# Patient Record
Sex: Male | Born: 1980
Health system: Midwestern US, Community
[De-identification: ages and names within clinical notes are randomized; demographics above are authoritative.]

## PROBLEM LIST (undated history)

## (undated) DIAGNOSIS — N189 Chronic kidney disease, unspecified: Secondary | ICD-10-CM

## (undated) DIAGNOSIS — I10 Essential (primary) hypertension: Secondary | ICD-10-CM

## (undated) HISTORY — PX: LITHOTRIPSY: SUR834

## (undated) HISTORY — DX: Essential (primary) hypertension: I10

## (undated) HISTORY — PX: OTHER SURGICAL HISTORY: SHX169

## (undated) HISTORY — DX: Chronic kidney disease, unspecified: N18.9

---

## 2005-06-13 ENCOUNTER — Emergency Department (HOSPITAL_COMMUNITY): Admission: EM | Admit: 2005-06-13 | Discharge: 2005-06-13 | Payer: Self-pay | Admitting: Emergency Medicine

## 2006-04-24 ENCOUNTER — Ambulatory Visit (HOSPITAL_COMMUNITY): Admission: RE | Admit: 2006-04-24 | Discharge: 2006-04-24 | Payer: Self-pay | Admitting: Specialist

## 2010-07-21 ENCOUNTER — Encounter: Admission: RE | Admit: 2010-07-21 | Discharge: 2010-07-21 | Payer: Self-pay | Admitting: Anesthesiology

## 2011-11-06 ENCOUNTER — Other Ambulatory Visit (HOSPITAL_COMMUNITY): Payer: Self-pay | Admitting: Physician Assistant

## 2011-11-06 ENCOUNTER — Ambulatory Visit (HOSPITAL_COMMUNITY)
Admission: RE | Admit: 2011-11-06 | Discharge: 2011-11-06 | Disposition: A | Discharge status: Home / Self Care | Payer: 59 | Source: Ambulatory Visit | Attending: Physician Assistant | Admitting: Physician Assistant

## 2011-11-06 DIAGNOSIS — M546 Pain in thoracic spine: Secondary | ICD-10-CM | POA: Insufficient documentation

## 2011-11-06 DIAGNOSIS — M549 Dorsalgia, unspecified: Secondary | ICD-10-CM

## 2012-01-03 ENCOUNTER — Other Ambulatory Visit: Payer: Self-pay | Admitting: Anesthesiology

## 2012-01-03 DIAGNOSIS — M545 Low back pain: Secondary | ICD-10-CM

## 2012-01-05 ENCOUNTER — Ambulatory Visit
Admission: RE | Admit: 2012-01-05 | Discharge: 2012-01-05 | Disposition: A | Discharge status: Home / Self Care | Payer: 59 | Source: Ambulatory Visit | Attending: Anesthesiology | Admitting: Anesthesiology

## 2012-01-05 DIAGNOSIS — M545 Low back pain: Secondary | ICD-10-CM

## 2012-07-29 ENCOUNTER — Ambulatory Visit (INDEPENDENT_AMBULATORY_CARE_PROVIDER_SITE_OTHER): Payer: 59 | Admitting: Emergency Medicine

## 2012-07-29 VITALS — BP 118/88 | HR 88 | Temp 97.6°F | Resp 16 | Ht 67.0 in | Wt 144.0 lb

## 2012-07-29 DIAGNOSIS — IMO0002 Reserved for concepts with insufficient information to code with codable children: Secondary | ICD-10-CM

## 2012-07-29 DIAGNOSIS — M24419 Recurrent dislocation, unspecified shoulder: Secondary | ICD-10-CM

## 2012-07-29 DIAGNOSIS — S46919A Strain of unspecified muscle, fascia and tendon at shoulder and upper arm level, unspecified arm, initial encounter: Secondary | ICD-10-CM

## 2012-07-29 MED ORDER — NAPROXEN SODIUM 550 MG PO TABS: 550.0000 mg | ORAL_TABLET | Freq: Two times a day (BID) | ORAL | Status: AC

## 2012-07-29 MED ORDER — CYCLOBENZAPRINE HCL 10 MG PO TABS: 10.0000 mg | ORAL_TABLET | Freq: Three times a day (TID) | ORAL | Status: AC | PRN

## 2012-07-29 NOTE — Progress Notes (Signed)
   Date:  07/29/2012   Name:  Wesley Gross   DOB:  August 31, 1981   MRN:  [id number] Gender: male Age: 31 y.o.  PCP:  No primary provider on file.    Chief Complaint: Shoulder Injury   History of Present Illness:  Wesley Gross is a 31 y.o. 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 Yesterday was tubing and re-injured his left shoulder while tubing.  Is on a pain management contract and takes cymbalta for pain control.  Has a printed out "DNA study" that shows medications that are effective and which are not for him.  Says that pain is worse with activity and lying on that side and radiates up into neck.  There are no neuro symptoms or radicular symptoms.  Pain less when he does not use it.  Worse when he raises arm to parallel to floor.  There is no problem list on file for this patient.   No past medical history on file.  No past surgical history on file.  History  Substance Use Topics  . Smoking status: Current Everyday Smoker  . Smokeless tobacco: Not on file  . Alcohol Use: Not on file    No family history on file.  Allergies  Allergen Reactions  . Cephalexin Nausea And Vomiting    Medication list has been reviewed and updated.  Current Outpatient Prescriptions on File Prior to Visit  Medication Sig Dispense Refill  . DULoxetine (CYMBALTA) 30 MG capsule Take 90 mg by mouth daily.        Review of Systems:  As per HPI, otherwise negative.    Physical Examination: Filed Vitals:   07/29/12 0810  BP: 118/88  Pulse: 88  Temp: 97.6 F (36.4 C)  Resp: 16   Filed Vitals:   07/29/12 0810  Height: 5\' 7"  (1.702 m)  Weight: 144 lb (65.318 kg)   Body mass index is 22.55 kg/(m^2). Ideal Body Weight: Weight in (lb) to have BMI = 25: 159.3    GEN: WDWN, NAD, Non-toxic, Alert & Oriented x 3 HEENT: Atraumatic, Normocephalic.  Ears and Nose: No external deformity. EXTR: No  clubbing/cyanosis/edema NEURO: Normal gait.  PSYCH: Normally interactive. Conversant. Not depressed or anxious appearing.  Calm demeanor.  Left Shoulder:  Tender trapezius.  Gross motor intact.  Normal strength and active ROM.    Assessment and Plan: Shoulder strain and history of chronic dislocations Ortho consult Anaprox Flexeril Follow up as needed.   Carmelina Dane, MD

## 2013-12-22 ENCOUNTER — Other Ambulatory Visit: Payer: Self-pay | Admitting: Anesthesiology

## 2013-12-22 DIAGNOSIS — M545 Low back pain, unspecified: Secondary | ICD-10-CM

## 2013-12-27 ENCOUNTER — Ambulatory Visit
Admission: RE | Admit: 2013-12-27 | Discharge: 2013-12-27 | Disposition: A | Discharge status: Home / Self Care | Payer: 59 | Source: Ambulatory Visit | Attending: Anesthesiology | Admitting: Anesthesiology

## 2013-12-27 DIAGNOSIS — M545 Low back pain, unspecified: Secondary | ICD-10-CM

## 2015-01-04 ENCOUNTER — Ambulatory Visit: Payer: Self-pay | Admitting: Podiatry

## 2015-01-04 ENCOUNTER — Ambulatory Visit (INDEPENDENT_AMBULATORY_CARE_PROVIDER_SITE_OTHER): Payer: 59 | Admitting: Podiatry

## 2015-01-04 ENCOUNTER — Encounter: Payer: Self-pay | Admitting: Podiatry

## 2015-01-04 ENCOUNTER — Ambulatory Visit: Payer: Self-pay

## 2015-01-04 VITALS — BP 147/95 | HR 71 | Resp 14

## 2015-01-04 DIAGNOSIS — M779 Enthesopathy, unspecified: Secondary | ICD-10-CM

## 2015-01-04 DIAGNOSIS — M79671 Pain in right foot: Secondary | ICD-10-CM

## 2015-01-04 DIAGNOSIS — M722 Plantar fascial fibromatosis: Secondary | ICD-10-CM

## 2015-01-04 MED ORDER — DICLOFENAC SODIUM 75 MG PO TBEC: 75.0000 mg | DELAYED_RELEASE_TABLET | Freq: Two times a day (BID) | ORAL | Status: DC

## 2015-01-04 MED ORDER — TRIAMCINOLONE ACETONIDE 10 MG/ML IJ SUSP
10.0000 mg | Freq: Once | INTRAMUSCULAR | Status: AC
  Administered 2015-01-04: 10 mg

## 2015-01-04 NOTE — Progress Notes (Signed)
   Subjective:    Patient ID: Wesley Gross, male    DOB: 06/17/1981, 34 y.o.   MRN: [id number]  HPI Pt presents with pain in ball and arch of right foot   Review of Systems  All other systems reviewed and are negative.      Objective:   Physical Exam        Assessment & Plan:

## 2015-01-06 NOTE — Progress Notes (Signed)
Subjective:     Patient ID: Wesley Gross, male   DOB: 10/05/1981, 34 y.o.   MRN: [id number]  HPI patient presents stating he's had a lot of pain in his forefoot for the last several months and does not remember specific injury. States it's worse with ambulation and that it is gradually making it hard for him to be active   Review of Systems  All other systems reviewed and are negative.      Objective:   Physical Exam  Constitutional: He is oriented to person, place, and time.  Cardiovascular: Intact distal pulses.   Musculoskeletal: Normal range of motion.  Neurological: He is oriented to person, place, and time.  Skin: Skin is warm.  Nursing note and vitals reviewed.  neurovascular status is intact with muscle strength adequate and range of motion subtalar midtarsal joint within normal limits. Patient is noted to have discomfort with inflammation and pain second metatarsophalangeal joint right with fluid buildup within the joint and pain also spreading into the arch and heel of a mild to moderate nature     Assessment:     Inflammatory capsulitis second MPJ right with inflammation and fluid    Plan:     H&P and condition discussed with patient. At this point I have recommended careful injection and did proximal nerve block and explained risk of procedure. I aspirated the joint giving out a small amount of clear fluid and injected with half cc of dexamethasone Kenalog and applied padding to reduce stress. Reappoint to reevaluate in the next several weeks

## 2015-01-13 ENCOUNTER — Encounter: Payer: Self-pay | Admitting: Podiatry

## 2015-01-13 ENCOUNTER — Ambulatory Visit (INDEPENDENT_AMBULATORY_CARE_PROVIDER_SITE_OTHER): Payer: 59 | Admitting: Podiatry

## 2015-01-13 VITALS — BP 142/100 | HR 77 | Resp 16

## 2015-01-13 DIAGNOSIS — M722 Plantar fascial fibromatosis: Secondary | ICD-10-CM

## 2015-01-13 DIAGNOSIS — M779 Enthesopathy, unspecified: Secondary | ICD-10-CM

## 2015-01-13 NOTE — Progress Notes (Signed)
Subjective:     Patient ID: Wesley Gross, male   DOB: 12/10/1980, 34 y.o.   MRN: [id number]  HPI patient states that it has helped the area of my second toe joint but that the big toe joint has been sore on my right foot and that does not seem to have improved   Review of Systems     Objective:   Physical Exam Neurovascular status intact no change in health history with discomfort that has alleviated second MPJ right and discomfort in the big toe joint right on both the medial and dorsal side with inflammation and moderate elevation of the arch with chronic pain within the fascia itself    Assessment:     Inflammatory capsulitis first MPJ right with improvement of the second MPJ right and chronic fasciitis symptoms    Plan:     Micah FlesherWent ahead and injected around the first MPJ 3 mg Kenalog 5 mg Xylocaine and scanned for custom orthotics to reduce all plantar stress against the foot. Reappoint when orthotics returned

## 2015-02-03 ENCOUNTER — Ambulatory Visit: Payer: 59 | Admitting: *Deleted

## 2015-02-03 DIAGNOSIS — M722 Plantar fascial fibromatosis: Secondary | ICD-10-CM

## 2015-02-03 NOTE — Patient Instructions (Signed)

## 2015-02-03 NOTE — Progress Notes (Signed)
Patient ID: Wesley Gross, male   DOB: 03/28/1981, 34 y.o.   MRN: [id number] PICKING UP INSERTS

## 2015-03-12 ENCOUNTER — Ambulatory Visit
Admission: RE | Admit: 2015-03-12 | Discharge: 2015-03-12 | Disposition: A | Discharge status: Home / Self Care | Payer: Self-pay | Source: Ambulatory Visit | Attending: Internal Medicine | Admitting: Internal Medicine

## 2015-03-12 ENCOUNTER — Other Ambulatory Visit: Payer: Self-pay | Admitting: Internal Medicine

## 2015-03-12 DIAGNOSIS — Z720 Tobacco use: Secondary | ICD-10-CM

## 2017-05-09 DIAGNOSIS — M545 Low back pain: Secondary | ICD-10-CM | POA: Diagnosis not present

## 2017-05-09 DIAGNOSIS — M791 Myalgia: Secondary | ICD-10-CM | POA: Diagnosis not present

## 2017-05-09 DIAGNOSIS — M461 Sacroiliitis, not elsewhere classified: Secondary | ICD-10-CM | POA: Diagnosis not present

## 2017-10-12 ENCOUNTER — Ambulatory Visit (INDEPENDENT_AMBULATORY_CARE_PROVIDER_SITE_OTHER): Payer: 59

## 2017-10-12 ENCOUNTER — Encounter: Payer: Self-pay | Admitting: Emergency Medicine

## 2017-10-12 ENCOUNTER — Ambulatory Visit (INDEPENDENT_AMBULATORY_CARE_PROVIDER_SITE_OTHER): Payer: 59 | Admitting: Emergency Medicine

## 2017-10-12 VITALS — BP 136/82 | HR 84 | Temp 99.6°F | Resp 17 | Ht 69.5 in | Wt 150.0 lb

## 2017-10-12 DIAGNOSIS — R091 Pleurisy: Secondary | ICD-10-CM | POA: Diagnosis not present

## 2017-10-12 DIAGNOSIS — R05 Cough: Secondary | ICD-10-CM | POA: Diagnosis not present

## 2017-10-12 DIAGNOSIS — J209 Acute bronchitis, unspecified: Secondary | ICD-10-CM

## 2017-10-12 DIAGNOSIS — F17200 Nicotine dependence, unspecified, uncomplicated: Secondary | ICD-10-CM | POA: Diagnosis not present

## 2017-10-12 DIAGNOSIS — R062 Wheezing: Secondary | ICD-10-CM

## 2017-10-12 MED ORDER — AZITHROMYCIN 250 MG PO TABS: ORAL_TABLET | ORAL | 0 refills | Status: DC

## 2017-10-12 MED ORDER — METHYLPREDNISOLONE SODIUM SUCC 125 MG IJ SOLR
125.0000 mg | Freq: Once | INTRAMUSCULAR | Status: AC
  Administered 2017-10-12: 125 mg via INTRAMUSCULAR

## 2017-10-12 MED ORDER — ALBUTEROL SULFATE HFA 108 (90 BASE) MCG/ACT IN AERS: 2.0000 | INHALATION_SPRAY | Freq: Four times a day (QID) | RESPIRATORY_TRACT | 0 refills | Status: DC | PRN

## 2017-10-12 MED ORDER — PREDNISONE 20 MG PO TABS: 40.0000 mg | ORAL_TABLET | Freq: Every day | ORAL | 0 refills | Status: AC

## 2017-10-12 NOTE — Patient Instructions (Addendum)
     IF you received an x-ray today, you will receive an invoice from [facility]. Please contact [facility] at [phone] with questions or concerns regarding your invoice.   IF you received labwork today, you will receive an invoice from [facility]. Please contact [facility] at [phone] with questions or concerns regarding your invoice.   Our billing staff will not be able to assist you with questions regarding bills from these companies.  You will be contacted with the lab results as soon as they are available. The fastest way to get your results is to activate your My Chart account. Instructions are located on the last page of this paperwork. If you have not heard from us regarding the results in 2 weeks, please contact this office.      Acute Bronchitis, Adult Acute bronchitis is when air tubes (bronchi) in the lungs suddenly get swollen. The condition can make it hard to breathe. It can also cause these symptoms:  A cough.  Coughing up clear, yellow, or green mucus.  Wheezing.  Chest congestion.  Shortness of breath.  A fever.  Body aches.  Chills.  A sore throat.  Follow these instructions at home: Medicines  Take over-the-counter and prescription medicines only as told by your doctor.  If you were prescribed an antibiotic medicine, take it as told by your doctor. Do not stop taking the antibiotic even if you start to feel better. General instructions  Rest.  Drink enough fluids to keep your pee (urine) clear or pale yellow.  Avoid smoking and secondhand smoke. If you smoke and you need help quitting, ask your doctor. Quitting will help your lungs heal faster.  Use an inhaler, cool mist vaporizer, or humidifier as told by your doctor.  Keep all follow-up visits as told by your doctor. This is important. How is this prevented? To lower your risk of getting this condition again:  Wash your hands often with soap and water. If you cannot  use soap and water, use hand sanitizer.  Avoid contact with people who have cold symptoms.  Try not to touch your hands to your mouth, nose, or eyes.  Make sure to get the flu shot every year.  Contact a doctor if:  Your symptoms do not get better in 2 weeks. Get help right away if:  You cough up blood.  You have chest pain.  You have very bad shortness of breath.  You become dehydrated.  You faint (pass out) or keep feeling like you are going to pass out.  You keep throwing up (vomiting).  You have a very bad headache.  Your fever or chills gets worse. This information is not intended to replace advice given to you by your health care provider. Make sure you discuss any questions you have with your health care provider. Document Released: 05/01/2008 Document Revised: 06/21/2016 Document Reviewed: 05/03/2016 Elsevier Interactive Patient Education  2017 Elsevier Inc.  

## 2017-10-12 NOTE — Progress Notes (Signed)
Wesley Gross 36 y.o.   Chief Complaint  Patient presents with  . URI  . Cough    HISTORY OF PRESENT ILLNESS: This is a 36 y.o. male complaining of URI symptoms x 2 weeks; saw MD who started Amoxicillin and Tessalon; not better; smoker.  Cough  This is a new problem. The current episode started 1 to 4 weeks ago. The problem has been gradually worsening. The problem occurs every few minutes. The cough is productive of sputum. Associated symptoms include chest pain (pleuritic) and wheezing. Pertinent negatives include no chills, eye redness, fever, headaches, hemoptysis, rash or sore throat.     Prior to Admission medications   Not on File    Allergies  Allergen Reactions  . Cephalexin Nausea And Vomiting    There are no active problems to display for this patient.   No past medical history on file.  No past surgical history on file.  Social History   Socioeconomic History  . Marital status: Married    Spouse name: Not on file  . Number of children: Not on file  . Years of education: Not on file  . Highest education level: Not on file  Social Needs  . Financial resource strain: Not on file  . Food insecurity - worry: Not on file  . Food insecurity - inability: Not on file  . Transportation needs - medical: Not on file  . Transportation needs - non-medical: Not on file  Occupational History  . Not on file  Tobacco Use  . Smoking status: Current Every Day Smoker    Packs/day: 2.00    Years: 22.00    Pack years: 44.00  . Smokeless tobacco: Never Used  Substance and Sexual Activity  . Alcohol use: No    Frequency: Never  . Drug use: No  . Sexual activity: No  Other Topics Concern  . Not on file  Social History Narrative  . Not on file    No family history on file.   Review of Systems  Constitutional: Negative for chills, fever and malaise/fatigue.  HENT: Positive for congestion. Negative for sore throat.   Eyes: Negative for discharge and redness.    Respiratory: Positive for cough, sputum production and wheezing. Negative for hemoptysis.   Cardiovascular: Positive for chest pain (pleuritic). Negative for leg swelling.  Gastrointestinal: Negative for abdominal pain, diarrhea, nausea and vomiting.  Genitourinary: Negative for dysuria and hematuria.  Musculoskeletal: Negative.  Negative for neck pain.  Skin: Negative for rash.  Neurological: Negative for dizziness and headaches.  Endo/Heme/Allergies: Negative.   All other systems reviewed and are negative.  Vitals:   10/12/17 1707  BP: 136/82  Pulse: 84  Resp: 17  Temp: 99.6 F (37.6 C)  SpO2: 98%      Physical Exam  Constitutional: He is oriented to person, place, and time. He appears well-developed and well-nourished.  HENT:  Head: Normocephalic and atraumatic.  Nose: Nose normal.  Mouth/Throat: Oropharynx is clear and moist.  Eyes: Conjunctivae and EOM are normal. Pupils are equal, round, and reactive to light.  Neck: Normal range of motion. Neck supple. No thyromegaly present.  Cardiovascular: Normal rate, regular rhythm and normal heart sounds.  Pulmonary/Chest: Effort normal. He has wheezes.  Abdominal: Soft. Bowel sounds are normal. There is no tenderness.  Musculoskeletal: Normal range of motion.  Lymphadenopathy:    He has no cervical adenopathy.  Neurological: He is alert and oriented to person, place, and time. No sensory deficit. He exhibits normal muscle  tone.  Skin: Skin is warm and dry. Capillary refill takes less than 2 seconds. No rash noted.  Psychiatric: He has a normal mood and affect. His behavior is normal.  Vitals reviewed.    ASSESSMENT & PLAN:  Wesley Gross was seen today for uri and cough.  Diagnoses and all orders for this visit:  Acute bronchitis, unspecified organism -     DG Chest 2 View; Future -     methylPREDNISolone sodium succinate (SOLU-MEDROL) 125 mg/2 mL injection 125 mg  Pleurisy  Wheezing  Smoker  Other orders -      predniSONE (DELTASONE) 20 MG tablet; Take 2 tablets (40 mg total) daily with breakfast for 5 days by mouth. -     azithromycin (ZITHROMAX) 250 MG tablet; Sig as indicated -     albuterol (PROVENTIL HFA;VENTOLIN HFA) 108 (90 Base) MCG/ACT inhaler; Inhale 2 puffs every 6 (six) hours as needed into the lungs for wheezing or shortness of breath.    Patient Instructions       IF you received an x-ray today, you will receive an [facility]. Please contact [facility] at [phone] with questions or concerns regarding your invoice.   IF you received labwork today, you will receive an invoice from [facility]. Please contact [facility] at [phone] with questions or concerns regarding your invoice.   Our billing staff will not be able to assist you with questions regarding bills from these companies.  You will be contacted with the lab results as soon as they are available. The fastest way to get your results is to activate your My Chart account. Instructions are located on the last page of this paperwork. If you have not heard from us regarding the results in 2 weeks, please contact this office.      Acute Bronchitis, Adult Acute bronchitis is when air tubes (bronchi) in the lungs suddenly get swollen. The condition can make it hard to breathe. It can also cause these symptoms:  A cough.  Coughing up clear, yellow, or green mucus.  Wheezing.  Chest congestion.  Shortness of breath.  A fever.  Body aches.  Chills.  A sore throat.  Follow these instructions at home: Medicines  Take over-the-counter and prescription medicines only as told by your doctor.  If you were prescribed an antibiotic medicine, take it as told by your doctor. Do not stop taking the antibiotic even if you start to feel better. General instructions  Rest.  Drink enough fluids to keep your pee (urine) clear or pale yellow.  Avoid smoking and secondhand smoke. If you  smoke and you need help quitting, ask your doctor. Quitting will help your lungs heal faster.  Use an inhaler, cool mist vaporizer, or humidifier as told by your doctor.  Keep all follow-up visits as told by your doctor. This is important. How is this prevented? To lower your risk of getting this condition again:  Wash your hands often with soap and water. If you cannot use soap and water, use hand sanitizer.  Avoid contact with people who have cold symptoms.  Try not to touch your hands to your mouth, nose, or eyes.  Make sure to get the flu shot every year.  Contact a doctor if:  Your symptoms do not get better in 2 weeks. Get help right away if:  You cough up blood.  You have chest pain.  You have very bad shortness of breath.  You become dehydrated.  You faint (pass out) or  keep feeling like you are going to pass out.  You keep throwing up (vomiting).  You have a very bad headache.  Your fever or chills gets worse. This information is not intended to replace advice given to you by your health care provider. Make sure you discuss any questions you have with your health care provider. Document Released: 05/01/2008 Document Revised: 06/21/2016 Document Reviewed: 05/03/2016 Elsevier Interactive Patient Education  2017 Elsevier Inc.     Edwina BarthMiguel Berdell Hostetler, MD [facility]

## 2017-12-29 IMAGING — DX DG CHEST 2V
2 series · 2 of 2 positions shown · non-contrast
Comparison: None.

CLINICAL DATA: Cough.

EXAM:
CHEST  2 VIEW

[chest pa]
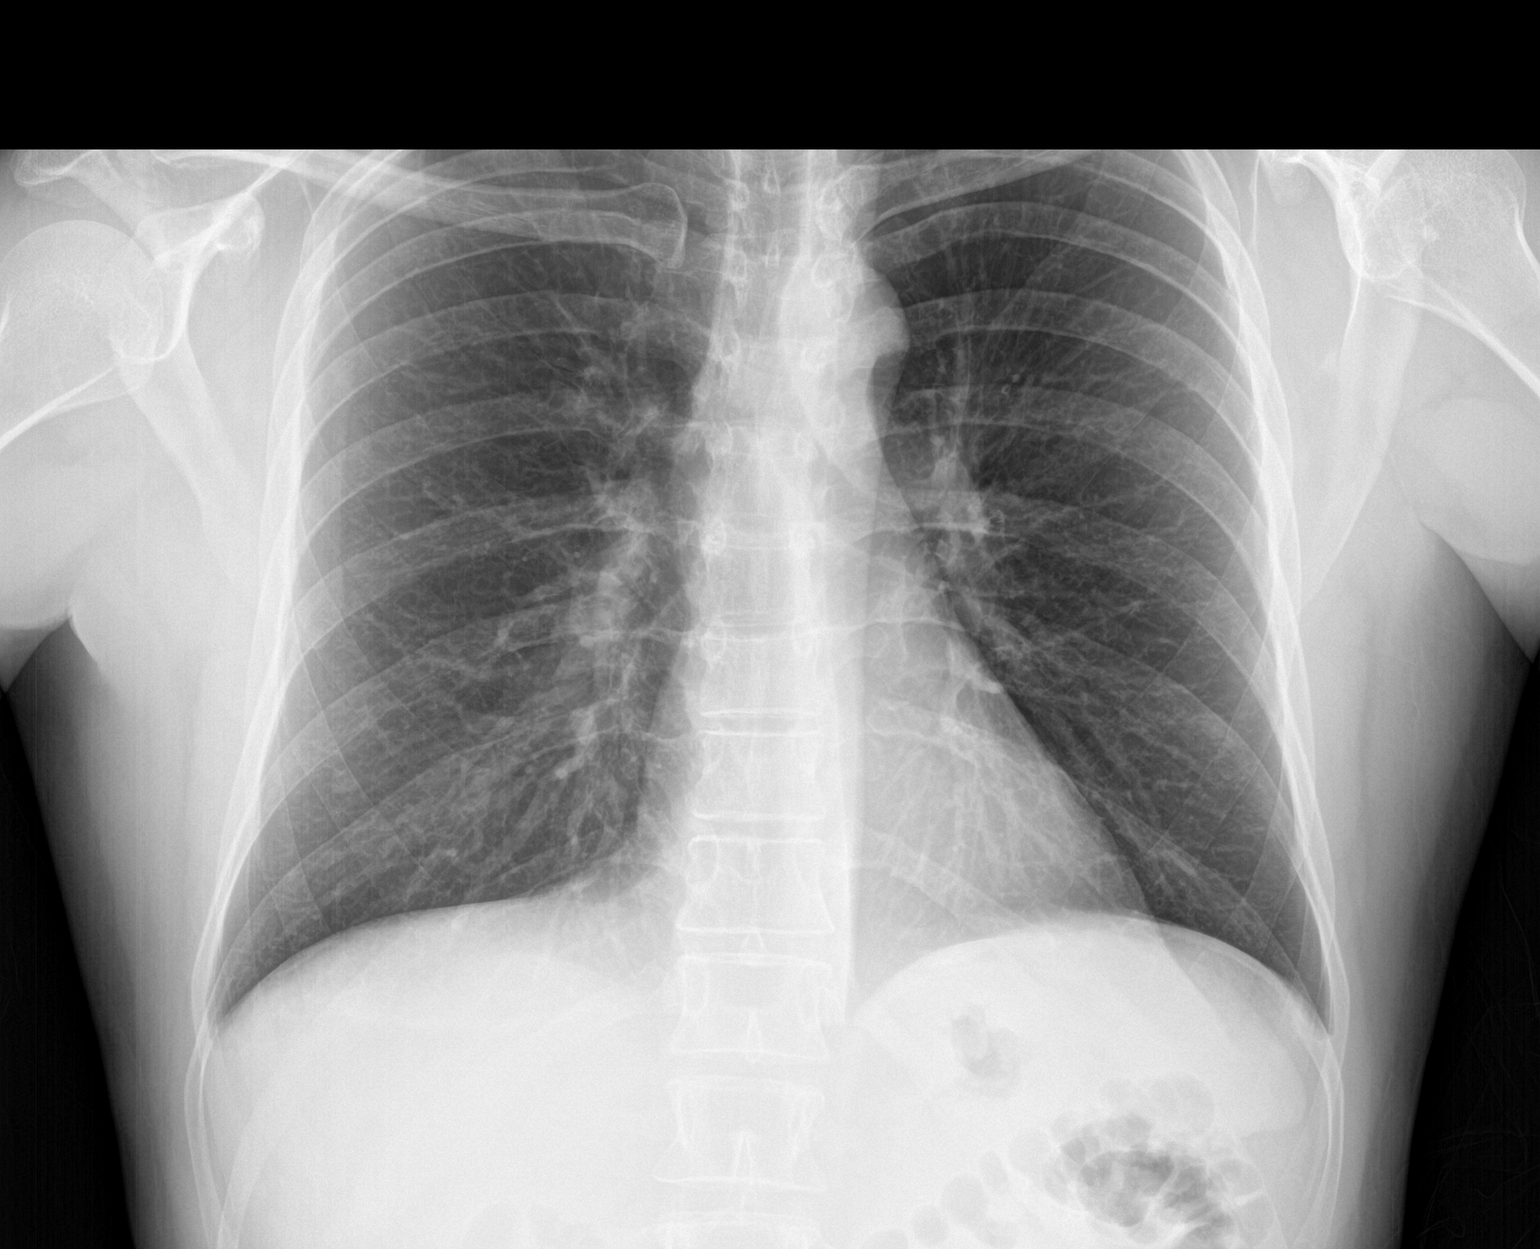

[chest lat]
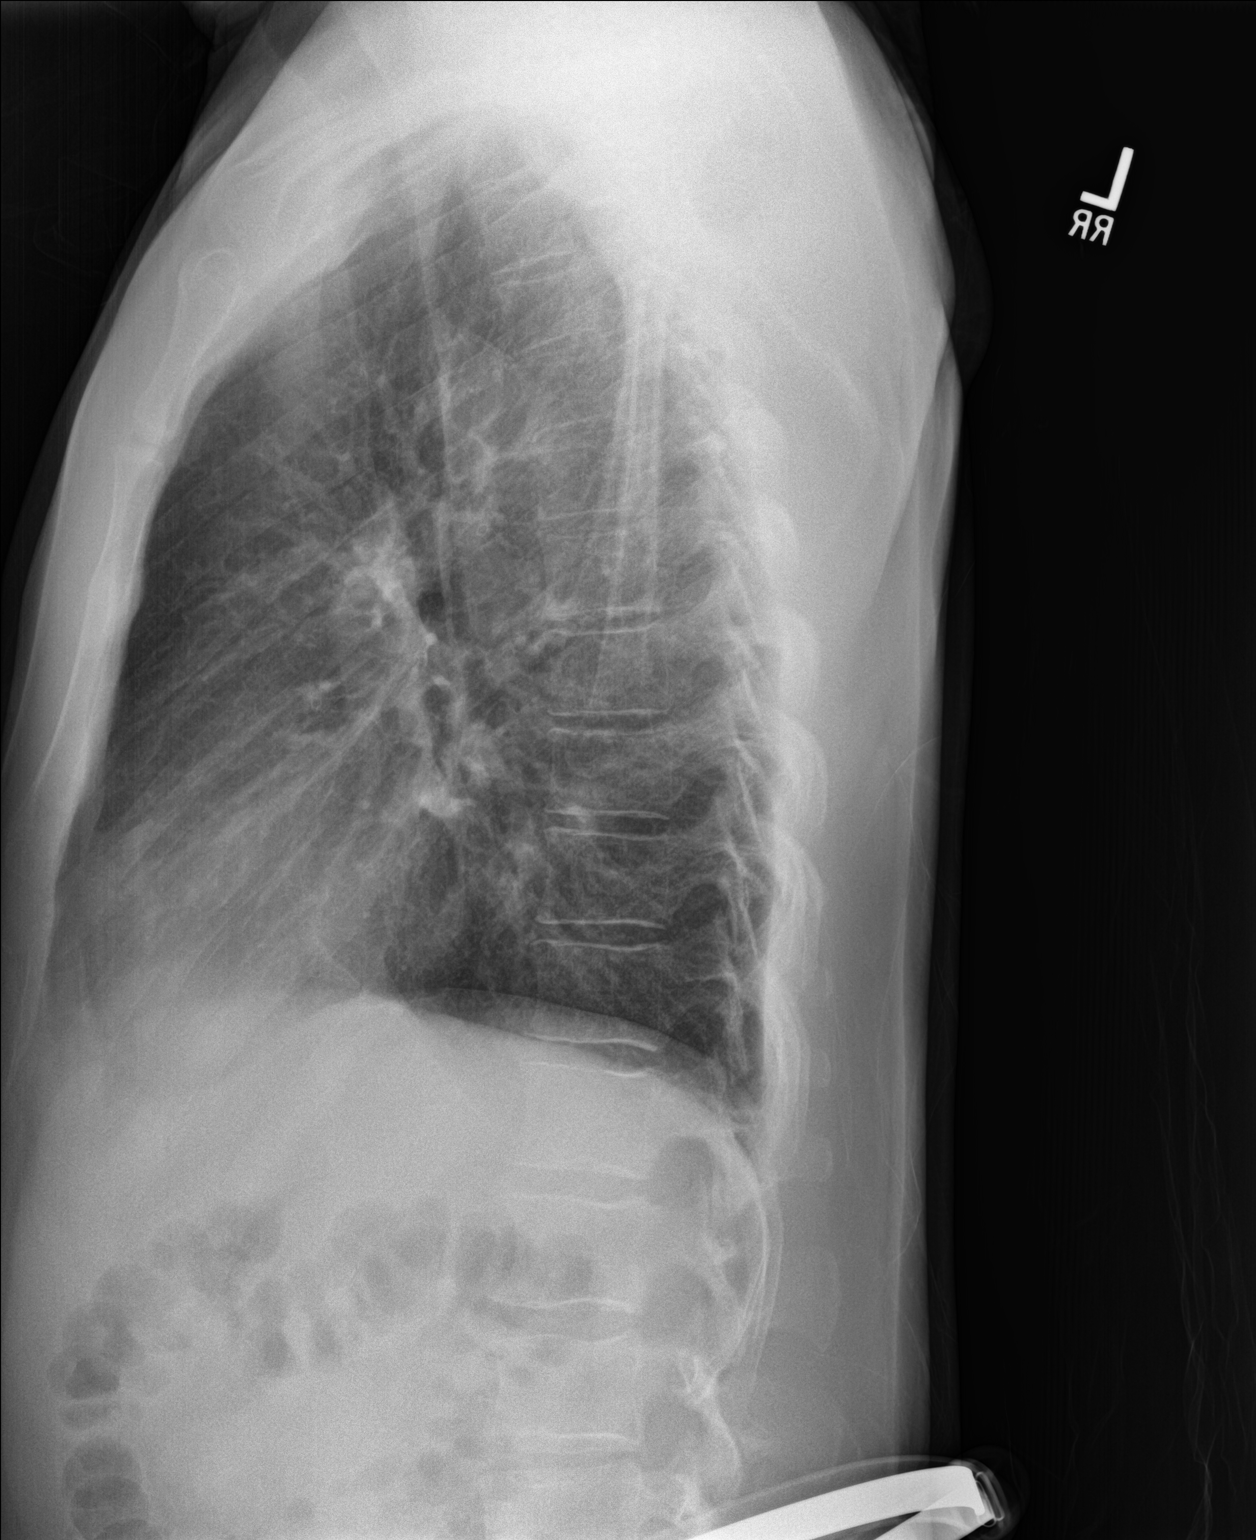

[2 of 2 positions shown; findings below may reference images not displayed]

FINDINGS: Normal sized heart. Clear lungs. Bilateral nipple shadows. Minimal
central peribronchial thickening. Unremarkable bones.
IMPRESSION: Minimal bronchitic changes.

## 2018-06-10 DIAGNOSIS — M546 Pain in thoracic spine: Secondary | ICD-10-CM | POA: Diagnosis not present

## 2018-06-10 DIAGNOSIS — S43402A Unspecified sprain of left shoulder joint, initial encounter: Secondary | ICD-10-CM | POA: Diagnosis not present

## 2018-06-10 DIAGNOSIS — S43401A Unspecified sprain of right shoulder joint, initial encounter: Secondary | ICD-10-CM | POA: Diagnosis not present

## 2018-07-07 NOTE — Progress Notes (Signed)
Subjective:    Patient ID: Wesley Gross, male    DOB: [birth year], 36 y.o.   MRN: [id number]  HPI:  Wesley Gross is here to establish as a new pt.  He is a pleasant 37 yr old male. PMH: Asthma r/t seasonal allergies and sig tobacco use - 2 packs/day for >20 years He was initially dx'd with htn 2014 and started on Amlodipine/Valsartan -uncertain of dosage. He stopped rx 2018. He reports sig stress March 2019 and BP was elevated- SBP 160-170s, DBP 90-110s He was re-started on combination anti-hypertensive. May 2019- he started to experience dizziness and SBP 90-100s, he again stopped rx He denies current CP/dyspnea/palpitations/HA/dizziness. He works at [employer] as Conservation officer, nature" and is lifting/bending/pushing/pulling 5 days/week. He denies formal exercise outside of work. He estimates to drink 30-40 oz plain water/day and has been reducing saturated fat from diet the last few months. He drinks 3 alcoholic drinks/week He has one acute complaint- 2 plantar warts on bottom of R foot- mildly painful with prolonged standing/walking  Patient Care Team    Relationship Specialty Notifications Start End  Julaine Fusi, NP PCP - General Family Medicine  07/08/18     Patient Active Problem List   Diagnosis Date Noted  . Healthcare maintenance 07/08/2018  . Plantar warts 07/08/2018  . Tobacco use disorder 07/08/2018  . Acute bronchitis 10/12/2017  . Pleurisy 10/12/2017  . Wheezing 10/12/2017  . Smoker 10/12/2017     Past Medical History:  Diagnosis Date  . Hypertension      History reviewed. No pertinent surgical history.   Family History  Problem Relation Age of Onset  . Hyperlipidemia Mother   . Hypertension Mother      Social History   Substance and Sexual Activity  Drug Use No     Social History   Substance and Sexual Activity  Alcohol Use Yes  . Alcohol/week: 3.0 standard drinks  . Types: 3 Cans of beer per week  . Frequency: Never     Social History    Tobacco Use  Smoking Status Current Every Day Smoker  . Packs/day: 2.00  . Years: 22.00  . Pack years: 44.00  Smokeless Tobacco Never Used     Outpatient Encounter Medications as of 07/08/2018  Medication Sig  . [DISCONTINUED] albuterol (PROVENTIL HFA;VENTOLIN HFA) 108 (90 Base) MCG/ACT inhaler Inhale 2 puffs every 6 (six) hours as needed into the lungs for wheezing or shortness of breath.  . [DISCONTINUED] azithromycin (ZITHROMAX) 250 MG tablet Sig as indicated   No facility-administered encounter medications on file as of 07/08/2018.     Allergies: Cephalexin  Body mass index is 22.05 kg/m.  Blood pressure 128/73, pulse 73, height 5' 9.5" (1.765 m), weight 151 lb 8 oz (68.7 kg), SpO2 97 %.  Review of Systems  Constitutional: Positive for fatigue. Negative for activity change, appetite change, chills, diaphoresis, fever and unexpected weight change.  Eyes: Negative for visual disturbance.  Respiratory: Negative for cough, chest tightness, shortness of breath, wheezing and stridor.   Cardiovascular: Negative for chest pain, palpitations and leg swelling.  Gastrointestinal: Negative for abdominal distention, abdominal pain, blood in stool, constipation, diarrhea and nausea.  Genitourinary: Negative for difficulty urinating and flank pain.  Musculoskeletal: Negative for arthralgias, back pain, gait problem, joint swelling, myalgias, neck pain and neck stiffness.  Skin: Negative for color change, pallor, rash and wound.       2 plantar warts on R heel  Neurological: Negative for dizziness and headaches.  Hematological: Does not bruise/bleed easily.  Psychiatric/Behavioral: Negative for confusion, decreased concentration, dysphoric mood, hallucinations, self-injury, sleep disturbance and suicidal ideas. The patient is not nervous/anxious and is not hyperactive.        Objective:   Physical Exam  Constitutional: He is oriented to person, place, and time. He appears  well-developed and well-nourished. No distress.  HENT:  Head: Normocephalic and atraumatic.  Right Ear: External ear normal.  Left Ear: External ear normal.  Nose: Nose normal.  Mouth/Throat: Oropharynx is clear and moist.  Eyes: Pupils are equal, round, and reactive to light. Conjunctivae and EOM are normal.  Cardiovascular: Normal rate, regular rhythm, normal heart sounds and intact distal pulses.  No murmur heard. Pulmonary/Chest: Effort normal and breath sounds normal. No stridor. No respiratory distress. He has no wheezes. He has no rales. He exhibits no tenderness.  Neurological: He is alert and oriented to person, place, and time.  Skin: Skin is warm, dry and intact. Capillary refill takes less than 2 seconds. No rash noted. He is not diaphoretic. No erythema. No pallor.     2 discrete plantar warts on R heel  Psychiatric: He has a normal mood and affect. His behavior is normal. Judgment and thought content normal.      Assessment & Plan:   1. Plantar warts   2. Healthcare maintenance   3. Tobacco use disorder     Healthcare maintenance Increase water intake, strive for at least 75 ounces/day.   Follow Mediterranean diet Increase regular exercise.  Recommend at least 30 minutes daily, 5 days per week of walking, jogging, biking, swimming, YouTube/Pinterest workout videos. Reduce-stop tobacco use-YOU CAN DO IT Referral to Dermatology placed, re: Right foot plantar warts. Recommend fasting labs in a few weeks, then schedule complete physical a few days later. Continue to check your blood pressure and consistently >140/90, then call to make office visit.  Plantar warts Dermatology referral placed, re: R heel plantar warts  Tobacco use disorder Currently smoking >2 packs/day Been using tobacco >20 years Declined smoking cessation today.    FOLLOW-UP:  Return in about 2 months (around 09/07/2018) for CPE, Fasting Labs.

## 2018-07-08 ENCOUNTER — Ambulatory Visit (INDEPENDENT_AMBULATORY_CARE_PROVIDER_SITE_OTHER): Payer: 59 | Admitting: Adult Health

## 2018-07-08 ENCOUNTER — Encounter: Payer: Self-pay | Admitting: Adult Health

## 2018-07-08 VITALS — BP 128/73 | HR 73 | Ht 69.5 in | Wt 151.5 lb

## 2018-07-08 DIAGNOSIS — F17200 Nicotine dependence, unspecified, uncomplicated: Secondary | ICD-10-CM | POA: Diagnosis not present

## 2018-07-08 DIAGNOSIS — Z0000 Encounter for general adult medical examination without abnormal findings: Secondary | ICD-10-CM | POA: Diagnosis not present

## 2018-07-08 DIAGNOSIS — B070 Plantar wart: Secondary | ICD-10-CM | POA: Insufficient documentation

## 2018-07-08 NOTE — Assessment & Plan Note (Signed)
Currently smoking >2 packs/day Been using tobacco >20 years Declined smoking cessation today.

## 2018-07-08 NOTE — Assessment & Plan Note (Signed)
Increase water intake, strive for at least 75 ounces/day.   Follow Mediterranean diet Increase regular exercise.  Recommend at least 30 minutes daily, 5 days per week of walking, jogging, biking, swimming, YouTube/Pinterest workout videos. Reduce-stop tobacco use-YOU CAN DO IT Referral to Dermatology placed, re: Right foot plantar warts. Recommend fasting labs in a few weeks, then schedule complete physical a few days later. Continue to check your blood pressure and consistently >140/90, then call to make office visit.

## 2018-07-08 NOTE — Assessment & Plan Note (Signed)
Dermatology referral placed, re: R heel plantar warts

## 2018-07-08 NOTE — Patient Instructions (Signed)
Mediterranean Diet A Mediterranean diet refers to food and lifestyle choices that are based on the traditions of countries located on the [place]. This way of eating has been shown to help prevent certain conditions and improve outcomes for people who have chronic diseases, like kidney disease and heart disease. What are tips for following this plan? Lifestyle  Cook and eat meals together with your family, when possible.  Drink enough fluid to keep your urine clear or pale yellow.  Be physically active every day. This includes: ? Aerobic exercise like running or swimming. ? Leisure activities like gardening, walking, or housework.  Get 7-8 hours of sleep each night.  If recommended by your health care provider, drink red wine in moderation. This means 1 glass a day for nonpregnant women and 2 glasses a day for men. A glass of wine equals 5 oz (150 mL). Reading food labels  Check the serving size of packaged foods. For foods such as rice and pasta, the serving size refers to the amount of cooked product, not dry.  Check the total fat in packaged foods. Avoid foods that have saturated fat or trans fats.  Check the ingredients list for added sugars, such as corn syrup. Shopping  At the grocery store, buy most of your food from the areas near the walls of the store. This includes: ? Fresh fruits and vegetables (produce). ? Grains, beans, nuts, and seeds. Some of these may be available in unpackaged forms or large amounts (in bulk). ? Fresh seafood. ? Poultry and eggs. ? Low-fat dairy products.  Buy whole ingredients instead of prepackaged foods.  Buy fresh fruits and vegetables in-season from local farmers markets.  Buy frozen fruits and vegetables in resealable bags.  If you do not have access to quality fresh seafood, buy precooked frozen shrimp or canned fish, such as tuna, salmon, or sardines.  Buy small amounts of raw or cooked vegetables, salads, or olives from the  deli or salad bar at your store.  Stock your pantry so you always have certain foods on hand, such as olive oil, canned tuna, canned tomatoes, rice, pasta, and beans. Cooking  Cook foods with extra-virgin olive oil instead of using butter or other vegetable oils.  Have meat as a side dish, and have vegetables or grains as your main dish. This means having meat in small portions or adding small amounts of meat to foods like pasta or stew.  Use beans or vegetables instead of meat in common dishes like chili or lasagna.  Experiment with different cooking methods. Try roasting or broiling vegetables instead of steaming or sauteing them.  Add frozen vegetables to soups, stews, pasta, or rice.  Add nuts or seeds for added healthy fat at each meal. You can add these to yogurt, salads, or vegetable dishes.  Marinate fish or vegetables using olive oil, lemon juice, garlic, and fresh herbs. Meal planning  Plan to eat 1 vegetarian meal one day each week. Try to work up to 2 vegetarian meals, if possible.  Eat seafood 2 or more times a week.  Have healthy snacks readily available, such as: ? Vegetable sticks with hummus. ? Greek yogurt. ? Fruit and nut trail mix.  Eat balanced meals throughout the week. This includes: ? Fruit: 2-3 servings a day ? Vegetables: 4-5 servings a day ? Low-fat dairy: 2 servings a day ? Fish, poultry, or lean meat: 1 serving a day ? Beans and legumes: 2 or more servings a week ? Nuts   and seeds: 1-2 servings a day ? Whole grains: 6-8 servings a day ? Extra-virgin olive oil: 3-4 servings a day  Limit red meat and sweets to only a few servings a month What are my food choices?  Mediterranean diet ? Recommended ? Grains: Whole-grain pasta. Brown rice. Bulgar wheat. Polenta. Couscous. Whole-wheat bread. [place]. ? Vegetables: Artichokes. Beets. Broccoli. Cabbage. Carrots. Eggplant. Green beans. Chard. Kale. Spinach. Onions. Leeks. Peas. Squash.  Tomatoes. Peppers. Radishes. ? Fruits: Apples. Apricots. Avocado. Berries. Bananas. Cherries. Dates. Figs. Grapes. Lemons. Melon. Oranges. Peaches. Plums. Pomegranate. ? Meats and other protein foods: Beans. Almonds. Sunflower seeds. Pine nuts. Peanuts. [place]. Salmon. Scallops. Shrimp. [place]. Tilapia. Clams. Oysters. Eggs. ? Dairy: Low-fat milk. Cheese. Greek yogurt. ? Beverages: Water. Red wine. Herbal tea. ? Fats and oils: Extra virgin olive oil. Avocado oil. Grape seed oil. ? Sweets and desserts: [place] yogurt with honey. Baked apples. Poached pears. Trail mix. ? Seasoning and other foods: Basil. Cilantro. Coriander. Cumin. Mint. Parsley. Sage. Rosemary. Tarragon. Garlic. Oregano. Thyme. Pepper. Balsalmic vinegar. Tahini. Hummus. Tomato sauce. Olives. Mushrooms. ? Limit these ? Grains: Prepackaged pasta or rice dishes. Prepackaged cereal with added sugar. ? Vegetables: Deep fried potatoes (french fries). ? Fruits: Fruit canned in syrup. ? Meats and other protein foods: Beef. Pork. Lamb. Poultry with skin. Hot dogs. [place] Salines. ? Dairy: Ice cream. Sour cream. Whole milk. ? Beverages: Juice. Sugar-sweetened soft drinks. Beer. Liquor and spirits. ? Fats and oils: Butter. Canola oil. Vegetable oil. Beef fat (tallow). Lard. ? Sweets and desserts: Cookies. Cakes. Pies. Candy. ? Seasoning and other foods: Mayonnaise. Premade sauces and marinades. ? The items listed may not be a complete list. Talk with your dietitian about what dietary choices are right for you. Summary  The Mediterranean diet includes both food and lifestyle choices.  Eat a variety of fresh fruits and vegetables, beans, nuts, seeds, and whole grains.  Limit the amount of red meat and sweets that you eat.  Talk with your health care provider about whether it is safe for you to drink red wine in moderation. This means 1 glass a day for nonpregnant women and 2 glasses a day for men. A glass of wine equals 5 oz (150 mL). This information  is not intended to replace advice given to you by your health care provider. Make sure you discuss any questions you have with your health care provider. Document Released: 07/06/2016 Document Revised: 08/08/2016 Document Reviewed: 07/06/2016 Elsevier Interactive Patient Education  2018 [employer].   Tobacco Use Disorder Tobacco use disorder (TUD) is a mental disorder. It is the long-term use of tobacco in spite of related health problems or difficulty with normal life activities. Tobacco is most commonly smoked as cigarettes and less commonly as cigars or pipes. Smokeless chewing tobacco and snuff are also popular. People with TUD get a feeling of extreme pleasure (euphoria) from using tobacco and have a desire to use it again and again. Repeated use of tobacco can cause problems. The addictive effects of tobacco are due mainly tothe ingredient nicotine. Nicotine also causes a rush of adrenaline (epinephrine) in the body. This leads to increased blood pressure, heart rate, and breathing rate. These changes may cause problems for people with high blood pressure, weak hearts, or lung disease. High doses of nicotine in children and pets can lead to seizures and death. Tobacco contains a number of other unsafe chemicals. These chemicals are especially harmful when inhaled as smoke and can damage almost every organ in  the body. Smokers live shorter lives than nonsmokers and are at risk of dying from a number of diseases and cancers. Tobacco smoke can also cause health problems for nonsmokers (due to inhaling secondhand smoke). Smoking is also a fire hazard. TUD usually starts in the late teenage years and is most common in young adults between the ages of 13 and 31 years. People who start smoking earlier in life are more likely to continue smoking as adults. TUD is somewhat more common in men than women. People with TUD are at higher risk for using alcohol and other drugs of abuse. What increases the  risk? Risk factors for TUD include:  Having family members with the disorder.  Being around people who use tobacco.  Having an existing mental health issue such as schizophrenia, depression, bipolar disorder, ADHD, or posttraumatic stress disorder (PTSD).  What are the signs or symptoms? People with tobacco use disorder have two or more of the following signs and symptoms within 12 months:  Use of more tobacco over a longer period than intended.  Not able to cut down or control tobacco use.  A lot of time spent obtaining or using tobacco.  Strong desire or urge to use tobacco (craving). Cravings may last for 6 months or longer after quitting.  Use of tobacco even when use leads to major problems at work, school, or home.  Use of tobacco even when use leads to relationship problems.  Giving up or cutting down on important life activities because of tobacco use.  Repeatedly using tobacco in situations where it puts you or others in physical danger, like smoking in bed.  Use of tobacco even when it is known that a physical or mental problem is likely related to tobacco use. ? Physical problems are numerous and may include chronic bronchitis, emphysema, lung and other cancers, gum disease, high blood pressure, heart disease, and stroke. ? Mental problems caused by tobacco may include difficulty sleeping and anxiety.  Need to use greater amounts of tobacco to get the same effect. This means you have developed a tolerance.  Withdrawal symptoms as a result of stopping or rapidly cutting back use. These symptoms may last a month or more after quitting and include the following: ? Depressed, anxious, or irritable mood. ? Difficulty concentrating. ? Increased appetite. ? Restlessness or trouble sleeping. ? Use of tobacco to avoid withdrawal symptoms.  How is this diagnosed? Tobacco use disorder is diagnosed by your health care provider. A diagnosis may be made by:  Your health care  provider asking questions about your tobacco use and any problems it may be causing.  A physical exam.  Lab tests.  You may be referred to a mental health professional or addiction specialist.  The severity of tobacco use disorder depends on the number of signs and symptoms you have:  Mild-Two or three symptoms.  Moderate-Four or five symptoms.  Severe-Six or more symptoms.  How is this treated? Many people with tobacco use disorder are unable to quit on their own and need help. Treatment options include the following:  Nicotine replacement therapy (NRT). NRT provides nicotine without the other harmful chemicals in tobacco. NRT gradually lowers the dosage of nicotine in the body and reduces withdrawal symptoms. NRT is available in over-the-counter forms (gum, lozenges, and skin patches) as well as prescription forms (mouth inhaler and nasal spray).  Medicines.This may include: ? Antidepressant medicine that may reduce nicotine cravings. ? A medicine that acts on nicotine receptors in the  brain to reduce cravings and withdrawal symptoms. It may also block the effects of tobacco in people with TUD who relapse.  Counseling or talk therapy. A form of talk therapy called behavioral therapy is commonly used to treat people with TUD. Behavioral therapy looks at triggers for tobacco use, how to avoid them, and how to cope with cravings. It is most effective in person or by phone but is also available in self-help forms (books and Internet websites).  Support groups. These provide emotional support, advice, and guidance for quitting tobacco.  The most effective treatment for TUD is usually a combination of medicine, talk therapy, and support groups. Follow these instructions at home:  Keep all follow-up visits as directed by your health care provider. This is important.  Take medicines only as directed by your health care provider.  Check with your health care provider before starting new  prescription or over-the-counter medicines. Contact a health care provider if:  You are not able to take your medicines as prescribed.  Treatment is not helping your TUD and your symptoms get worse. Get help right away if:  You have serious thoughts about hurting yourself or others.  You have trouble breathing, chest pain, sudden weakness, or sudden numbness in part of your body. This information is not intended to replace advice given to you by your health care provider. Make sure you discuss any questions you have with your health care provider. Document Released: 07/19/2004 Document Revised: 07/16/2016 Document Reviewed: 01/09/2014 Elsevier Interactive Patient Education  2018 [employer]  Increase water intake, strive for at least 75 ounces/day.   Follow Mediterranean diet Increase regular exercise.  Recommend at least 30 minutes daily, 5 days per week of walking, jogging, biking, swimming, YouTube/Pinterest workout videos. Reduce-stop tobacco use-YOU CAN DO IT Referral to Dermatology placed, re: Right foot plantar warts. Recommend fasting labs in a few weeks, then schedule complete physical a few days later. Continue to check your blood pressure and consistently >140/90, then call to make office visit. WELCOME TO THE PRACTICE!

## 2018-07-23 DIAGNOSIS — B078 Other viral warts: Secondary | ICD-10-CM | POA: Diagnosis not present

## 2018-07-23 DIAGNOSIS — L570 Actinic keratosis: Secondary | ICD-10-CM | POA: Diagnosis not present

## 2018-07-23 DIAGNOSIS — L738 Other specified follicular disorders: Secondary | ICD-10-CM | POA: Diagnosis not present

## 2018-08-29 DIAGNOSIS — B078 Other viral warts: Secondary | ICD-10-CM | POA: Diagnosis not present

## 2018-08-29 DIAGNOSIS — Z23 Encounter for immunization: Secondary | ICD-10-CM | POA: Diagnosis not present

## 2018-10-01 ENCOUNTER — Ambulatory Visit (INDEPENDENT_AMBULATORY_CARE_PROVIDER_SITE_OTHER): Payer: 59 | Admitting: Adult Health

## 2018-10-01 ENCOUNTER — Encounter: Payer: Self-pay | Admitting: Adult Health

## 2018-10-01 VITALS — BP 131/74 | HR 85 | Temp 98.8°F | Ht 69.5 in | Wt 151.5 lb

## 2018-10-01 DIAGNOSIS — M545 Low back pain, unspecified: Secondary | ICD-10-CM | POA: Insufficient documentation

## 2018-10-01 MED ORDER — CYCLOBENZAPRINE HCL 10 MG PO TABS: 10.0000 mg | ORAL_TABLET | Freq: Three times a day (TID) | ORAL | 0 refills | Status: DC | PRN

## 2018-10-01 MED ORDER — METHYLPREDNISOLONE ACETATE 40 MG/ML IJ SUSP
40.0000 mg | Freq: Once | INTRAMUSCULAR | Status: AC
  Administered 2018-10-01: 40 mg via INTRAMUSCULAR

## 2018-10-01 MED ORDER — PREDNISONE 20 MG PO TABS: ORAL_TABLET | ORAL | 0 refills | Status: DC

## 2018-10-01 NOTE — Assessment & Plan Note (Addendum)
Depo-Medrol 81m injection given in clinic- pt tolerated well  Please start oral prednisone taper tomorrow 10/02/18. Please take Cyclobenzaprine 10mg  as needed. Please take OTC Ibuprofen 800mg  with food every 8 hrs as needed for pain. Follow care instructions as listed above. Work excuse provided, okay to return in 1 week. Please call with any questions/concerns.

## 2018-10-01 NOTE — Progress Notes (Addendum)
Subjective:    Patient ID: Wesley Gross, male    DOB: Mar 21, 1981, 37 y.o.   MRN: [id number]  HPI:  Mr. Marullo presents bil lumbar back pain without sciatica that spontaneously started yesterday morning and progressively worsened throughout the day. He denies acute injury/accident prior to onset of sx's. He reports constant "tightness/throbbing/aching" pain localized over lumbar back without radiation, pain rated 8/10.  He denies bowel/bladder dysfunction, numbness/tingling in lower extremities. He reports chronic back pain that will flare up every 6-12 months without acute injury. He continues to smoke heavily, 2 packs/day He was unable to report into to work today due to pain Of Note- Last MRI 12/27/13 - IMPRESSION: Lumbar disc degeneration multiple levels, stable from 01/05/2012. Negative for disc protrusion or neural impingement. Patient Care Team    Relationship Specialty Notifications Start End  Julaine Fusi, NP PCP - General Family Medicine  07/08/18     Patient Active Problem List   Diagnosis Date Noted  . Acute bilateral low back pain without sciatica 10/01/2018  . Healthcare maintenance 07/08/2018  . Plantar warts 07/08/2018  . Tobacco use disorder 07/08/2018  . Acute bronchitis 10/12/2017  . Pleurisy 10/12/2017  . Wheezing 10/12/2017  . Smoker 10/12/2017     Past Medical History:  Diagnosis Date  . Hypertension      History reviewed. No pertinent surgical history.   Family History  Problem Relation Age of Onset  . Hyperlipidemia Mother   . Hypertension Mother      Social History   Substance and Sexual Activity  Drug Use No     Social History   Substance and Sexual Activity  Alcohol Use Yes  . Alcohol/week: 3.0 standard drinks  . Types: 3 Cans of beer per week  . Frequency: Never     Social History   Tobacco Use  Smoking Status Current Every Day Smoker  . Packs/day: 2.00  . Years: 22.00  . Pack years: 44.00  Smokeless Tobacco Never  Used     Outpatient Encounter Medications as of 10/01/2018  Medication Sig  . cyclobenzaprine (FLEXERIL) 10 MG tablet Take 1 tablet (10 mg total) by mouth 3 (three) times daily as needed for muscle spasms.  . predniSONE (DELTASONE) 20 MG tablet 1 tab every 12 hrs for 3 days, then 1 tab daily for 3 days  . [EXPIRED] methylPREDNISolone acetate (DEPO-MEDROL) injection 40 mg    No facility-administered encounter medications on file as of 10/01/2018.     Allergies: Cephalexin  Body mass index is 22.05 kg/m.  Blood pressure 131/74, pulse 85, temperature 98.8 F (37.1 C), temperature source Oral, height 5' 9.5" (1.765 m), weight 151 lb 8 oz (68.7 kg), SpO2 97 %.  Review of Systems  Constitutional: Positive for activity change and fatigue. Negative for appetite change, chills, diaphoresis and fever.  Respiratory: Negative for cough, chest tightness, shortness of breath, wheezing and stridor.   Cardiovascular: Negative for chest pain, palpitations and leg swelling.  Gastrointestinal: Negative for abdominal distention, abdominal pain, blood in stool, constipation, diarrhea, nausea and vomiting.  Endocrine: Negative for cold intolerance, heat intolerance, polydipsia, polyphagia and polyuria.  Genitourinary: Negative for difficulty urinating, dysuria, flank pain and frequency.  Musculoskeletal: Positive for arthralgias, back pain, gait problem and myalgias.  Neurological: Negative for dizziness and headaches.  Hematological: Does not bruise/bleed easily.  Psychiatric/Behavioral: Positive for sleep disturbance.       Objective:   Physical Exam  Constitutional: He is oriented to person, place, and time. He appears  well-developed and well-nourished. He appears distressed.  Cardiovascular: Normal rate, regular rhythm, normal heart sounds and intact distal pulses.  Pulmonary/Chest: Effort normal and breath sounds normal. No stridor. No respiratory distress. He has no wheezes. He has no rales. He  exhibits no tenderness.  Abdominal: Soft. Bowel sounds are normal. He exhibits no distension and no mass. There is no tenderness. There is no rebound and no guarding. No hernia.  Musculoskeletal: He exhibits tenderness.       Thoracic back: Normal.       Lumbar back: He exhibits decreased range of motion, tenderness and spasm.  Very limited flexion/extension Limited lateral rotation Unable to perform straight leg raise test due to level of discomfort in pt  Neurological: He is alert and oriented to person, place, and time.  Skin: Skin is warm and dry. Capillary refill takes less than 2 seconds. No rash noted. He is not diaphoretic. No erythema. No pallor.  Psychiatric: He has a normal mood and affect. His behavior is normal. Judgment and thought content normal.  Nursing note and vitals reviewed.     Assessment & Plan:   1. Acute bilateral low back pain without sciatica     Acute bilateral low back pain without sciatica Depo-Medrol 58m injection given in clinic- pt tolerated well  Please start oral prednisone taper tomorrow 10/02/18. Please take Cyclobenzaprine 10mg  as needed. Please take OTC Ibuprofen 800mg  with food every 8 hrs as needed for pain. Follow care instructions as listed above. Work excuse provided, okay to return in 1 week. Please call with any questions/concerns.  Pt was in the office today for 25+ minutes, I spent >75% of  time in face to face counseling of patient's various medical conditions and in coordination of care FOLLOW-UP:  Return if symptoms worsen or fail to improve.

## 2018-10-01 NOTE — Patient Instructions (Addendum)
Back Pain, Adult Many adults have back pain from time to time. Common causes of back pain include:  A strained muscle or ligament.  Wear and tear (degeneration) of the spinal disks.  Arthritis.  A hit to the back.  Back pain can be short-lived (acute) or last a long time (chronic). A physical exam, lab tests, and imaging studies may be done to find the cause of your pain. Follow these instructions at home: Managing pain and stiffness  Take over-the-counter and prescription medicines only as told by your health care provider.  If directed, apply heat to the affected area as often as told by your health care provider. Use the heat source that your health care provider recommends, such as a moist heat pack or a heating pad. ? Place a towel between your skin and the heat source. ? Leave the heat on for 20-30 minutes. ? Remove the heat if your skin turns bright red. This is especially important if you are unable to feel pain, heat, or cold. You have a greater risk of getting burned.  If directed, apply ice to the injured area: ? Put ice in a plastic bag. ? Place a towel between your skin and the bag. ? Leave the ice on for 20 minutes, 2-3 times a day for the first 2-3 days. Activity  Do not stay in bed. Resting more than 1-2 days can delay your recovery.  Take short walks on even surfaces as soon as you are able. Try to increase the length of time you walk each day.  Do not sit, drive, or stand in one place for more than 30 minutes at a time. Sitting or standing for long periods of time can put stress on your back.  Use proper lifting techniques. When you bend and lift, use positions that put less stress on your back: ? [place] your knees. ? Keep the load close to your body. ? Avoid twisting.  Exercise regularly as told by your health care provider. Exercising will help your back heal faster. This also helps prevent back injuries by keeping muscles strong and flexible.  Your health  care provider may recommend that you see a physical therapist. This person can help you come up with a safe exercise program. Do any exercises as told by your physical therapist. Lifestyle  Maintain a healthy weight. Extra weight puts stress on your back and makes it difficult to have good posture.  Avoid activities or situations that make you feel anxious or stressed. Learn ways to manage anxiety and stress. One way to manage stress is through exercise. Stress and anxiety increase muscle tension and can make back pain worse. General instructions  Sleep on a firm mattress in a comfortable position. Try lying on your side with your knees slightly bent. If you lie on your back, put a pillow under your knees.  Follow your treatment plan as told by your health care provider. This may include: ? Cognitive or behavioral therapy. ? Acupuncture or massage therapy. ? Meditation or yoga. Contact a health care provider if:  You have pain that is not relieved with rest or medicine.  You have increasing pain going down into your legs or buttocks.  Your pain does not improve in 2 weeks.  You have pain at night.  You lose weight.  You have a fever or chills. Get help right away if:  You develop new bowel or bladder control problems.  You have unusual weakness or numbness in your arms  or legs.  You develop nausea or vomiting.  You develop abdominal pain.  You feel faint. Summary  Many adults have back pain from time to time. A physical exam, lab tests, and imaging studies may be done to find the cause of your pain.  Use proper lifting techniques. When you bend and lift, use positions that put less stress on your back.  Take over-the-counter and prescription medicines and apply heat or ice as directed by your health care provider. This information is not intended to replace advice given to you by your health care provider. Make sure you discuss any questions you have with your health care  provider. Document Released: 11/13/2005 Document Revised: 12/18/2016 Document Reviewed: 12/18/2016 Elsevier Interactive Patient Education  2018 [employer]  Please start oral prednisone taper tomorrow 10/02/18. Please take Cyclobenzaprine 10mg  as needed. Please take OTC Ibuprofen 800mg  with food every 8 hrs as needed for pain. Follow care instructions as listed above. Work excuse provided, okay to return in 1 week. Please call with any questions/concerns. FEEL BETTER!

## 2018-10-09 ENCOUNTER — Other Ambulatory Visit: Payer: 59

## 2018-10-09 DIAGNOSIS — Z0000 Encounter for general adult medical examination without abnormal findings: Secondary | ICD-10-CM | POA: Diagnosis not present

## 2018-10-10 LAB — CBC WITH DIFFERENTIAL/PLATELET
BASOS ABS: 0 10*3/uL (ref 0.0–0.2)
Basos: 0 %
EOS (ABSOLUTE): 0.2 10*3/uL (ref 0.0–0.4)
Eos: 2 %
Hematocrit: 44.8 % (ref 37.5–51.0)
Hemoglobin: 16.2 g/dL (ref 13.0–17.7)
IMMATURE GRANS (ABS): 0.1 10*3/uL (ref 0.0–0.1)
IMMATURE GRANULOCYTES: 1 %
LYMPHS: 24 %
Lymphocytes Absolute: 2.5 10*3/uL (ref 0.7–3.1)
MCH: 35 pg — ABNORMAL HIGH (ref 26.6–33.0)
MCHC: 36.2 g/dL — ABNORMAL HIGH (ref 31.5–35.7)
MCV: 97 fL (ref 79–97)
MONOCYTES: 8 %
Monocytes Absolute: 0.8 10*3/uL (ref 0.1–0.9)
NEUTROS PCT: 65 %
Neutrophils Absolute: 6.7 10*3/uL (ref 1.4–7.0)
PLATELETS: 229 10*3/uL (ref 150–450)
RBC: 4.63 x10E6/uL (ref 4.14–5.80)
RDW: 12.8 % (ref 12.3–15.4)
WBC: 10.4 10*3/uL (ref 3.4–10.8)

## 2018-10-10 LAB — COMPREHENSIVE METABOLIC PANEL
ALBUMIN: 4.7 g/dL (ref 3.5–5.5)
ALT: 23 IU/L (ref 0–44)
AST: 16 IU/L (ref 0–40)
Albumin/Globulin Ratio: 2.4 — ABNORMAL HIGH (ref 1.2–2.2)
Alkaline Phosphatase: 90 IU/L (ref 39–117)
BILIRUBIN TOTAL: 0.3 mg/dL (ref 0.0–1.2)
BUN/Creatinine Ratio: 19 (ref 9–20)
BUN: 20 mg/dL (ref 6–20)
CALCIUM: 9.5 mg/dL (ref 8.7–10.2)
CO2: 24 mmol/L (ref 20–29)
CREATININE: 1.05 mg/dL (ref 0.76–1.27)
Chloride: 103 mmol/L (ref 96–106)
GFR, EST AFRICAN AMERICAN: 104 mL/min/{1.73_m2} (ref >59)
GFR, EST NON AFRICAN AMERICAN: 90 mL/min/{1.73_m2} (ref >59)
GLUCOSE: 90 mg/dL (ref 65–99)
Globulin, Total: 2 g/dL (ref 1.5–4.5)
Potassium: 3.9 mmol/L (ref 3.5–5.2)
Sodium: 143 mmol/L (ref 134–144)
TOTAL PROTEIN: 6.7 g/dL (ref 6.0–8.5)

## 2018-10-10 LAB — LIPID PANEL
Chol/HDL Ratio: 4.9 ratio (ref 0.0–5.0)
Cholesterol, Total: 260 mg/dL — ABNORMAL HIGH (ref 100–199)
HDL: 53 mg/dL (ref >39)
LDL Calculated: 153 mg/dL — ABNORMAL HIGH (ref 0–99)
TRIGLYCERIDES: 269 mg/dL — AB (ref 0–149)
VLDL Cholesterol Cal: 54 mg/dL — ABNORMAL HIGH (ref 5–40)

## 2018-10-10 LAB — HEMOGLOBIN A1C
ESTIMATED AVERAGE GLUCOSE: 105 mg/dL
Hgb A1c MFr Bld: 5.3 % (ref 4.8–5.6)

## 2018-10-10 LAB — TSH: TSH: 4.15 u[IU]/mL (ref 0.450–4.500)

## 2018-10-10 NOTE — Progress Notes (Signed)
Subjective:    Patient ID: Wesley Gross, male    DOB: 1981-07-09, 37 y.o.   MRN: [id number]  HPI: Wesley Gross is here for CPE He reports sig reduction in back pain/spasm, now rated 2/10, previously rated 8/10 at 10/01/18 OV He continues with heavy tobacco use- 2packs/day, again declined smoking cessation. He reports productive cough (white/yellow mucus) > weeks, denies fever/night sweats/chills/N/V/D He reports a diet high in saturated fat/CHO/sugar  Reviewed Recent labs- TSH-WNL, 4.150 A1c-WNL, 5.3 CMP-WML CBC-WNL Lipid Panel- Tot 260 TGs 269 HDL 53 LDL 153 ASCVD 5.9%, opt 0.6% age adjusted for 39  Healthcare Maintenance: Immunizations-UTD   Patient Care Team    Relationship Specialty Notifications Start End  Julaine Fusi, NP PCP - General Family Medicine  07/08/18     Patient Active Problem List   Diagnosis Date Noted  . Mixed hyperlipidemia 10/14/2018  . Acute bilateral low back pain without sciatica 10/01/2018  . Healthcare maintenance 07/08/2018  . Plantar warts 07/08/2018  . Tobacco use disorder 07/08/2018  . Acute bronchitis 10/12/2017  . Pleurisy 10/12/2017  . Wheezing 10/12/2017  . Smoker 10/12/2017     Past Medical History:  Diagnosis Date  . Hypertension      History reviewed. No pertinent surgical history.   Family History  Problem Relation Age of Onset  . Hyperlipidemia Mother   . Hypertension Mother      Social History   Substance and Sexual Activity  Drug Use No     Social History   Substance and Sexual Activity  Alcohol Use Yes  . Alcohol/week: 3.0 standard drinks  . Types: 3 Cans of beer per week  . Frequency: Never     Social History   Tobacco Use  Smoking Status Current Every Day Smoker  . Packs/day: 2.00  . Years: 22.00  . Pack years: 44.00  Smokeless Tobacco Never Used     Outpatient Encounter Medications as of 10/14/2018  Medication Sig  . azithromycin (ZITHROMAX) 250 MG tablet 2 tabs day one. 1 tab days  two- five  . [DISCONTINUED] cyclobenzaprine (FLEXERIL) 10 MG tablet Take 1 tablet (10 mg total) by mouth 3 (three) times daily as needed for muscle spasms.  . [DISCONTINUED] predniSONE (DELTASONE) 20 MG tablet 1 tab every 12 hrs for 3 days, then 1 tab daily for 3 days   No facility-administered encounter medications on file as of 10/14/2018.     Allergies: Cephalexin  Body mass index is 22.9 kg/m.  Blood pressure 125/82, pulse 95, temperature 98.6 F (37 C), temperature source Oral, height 5' 9.5" (1.765 m), weight 157 lb 4.8 oz (71.4 kg), SpO2 97 %.   Review of Systems  Constitutional: Positive for fatigue. Negative for activity change, appetite change, chills, diaphoresis, fever and unexpected weight change.  HENT: Positive for congestion, postnasal drip and sinus pressure.   Eyes: Negative for visual disturbance.  Respiratory: Positive for cough. Negative for chest tightness, shortness of breath, wheezing and stridor.   Cardiovascular: Negative for chest pain, palpitations and leg swelling.  Gastrointestinal: Negative for abdominal distention, abdominal pain, blood in stool, constipation, diarrhea, nausea and vomiting.  Genitourinary: Negative for difficulty urinating and flank pain.  Musculoskeletal: Positive for back pain and myalgias. Negative for arthralgias, gait problem, joint swelling, neck pain and neck stiffness.  Skin: Negative for color change, pallor, rash and wound.  Neurological: Negative for headaches.  Hematological: Does not bruise/bleed easily.  Psychiatric/Behavioral: Negative for agitation, behavioral problems, confusion, decreased concentration, dysphoric mood, hallucinations, self-injury,  sleep disturbance and suicidal ideas. The patient is not nervous/anxious and is not hyperactive.        Objective:   Physical Exam  Constitutional: He is oriented to person, place, and time. He appears well-developed and well-nourished. No distress.  HENT:  Head:  Normocephalic and atraumatic.  Right Ear: External ear normal. Tympanic membrane is not erythematous and not bulging. No decreased hearing is noted.  Left Ear: External ear normal. Tympanic membrane is not erythematous and not bulging. No decreased hearing is noted.  Nose: Nose normal. No mucosal edema or rhinorrhea. Right sinus exhibits no maxillary sinus tenderness and no frontal sinus tenderness. Left sinus exhibits no maxillary sinus tenderness and no frontal sinus tenderness.  Mouth/Throat: Mucous membranes are normal. Abnormal dentition. Posterior oropharyngeal edema and posterior oropharyngeal erythema present. No oropharyngeal exudate or tonsillar abscesses. Tonsils are 0 on the right. Tonsils are 0 on the left. No tonsillar exudate.  Eyes: Pupils are equal, round, and reactive to light. Conjunctivae and EOM are normal.  Neck: Normal range of motion. Neck supple.  Cardiovascular: Normal rate, regular rhythm, normal heart sounds and intact distal pulses.  No murmur heard. Pulmonary/Chest: Effort normal and breath sounds normal. No stridor. No respiratory distress. He has no wheezes. He has no rales. He exhibits no tenderness.  Abdominal: Soft. Bowel sounds are normal. He exhibits no distension and no mass. There is no tenderness. There is no rebound and no guarding. No hernia.  Genitourinary:  Genitourinary Comments: Declined DRE/genital examination   Musculoskeletal: Normal range of motion. He exhibits tenderness.       Lumbar back: He exhibits tenderness and spasm.  Lymphadenopathy:    He has no cervical adenopathy.  Neurological: He is alert and oriented to person, place, and time. Coordination normal.  Skin: Skin is warm and dry. Capillary refill takes less than 2 seconds. He is not diaphoretic. No erythema. No pallor.  Psychiatric: He has a normal mood and affect. His behavior is normal. Judgment and thought content normal.  Nursing note and vitals reviewed.     Assessment & Plan:    1. Healthcare maintenance   2. Bronchitis   3. Acute bronchitis, unspecified organism   4. Mixed hyperlipidemia     Healthcare maintenance Continue to drink plenty of water and follow Mediterranean diet. Increase regular exercise. Please return in 6 months for fasting lab appt to re-check cholesterol levels- you can normalize with diet/exercise. Try to reduce-stop tobacco use- YOU CAN DO IT! Please take Azithromycin as directed. If your would life referral to Physical Therapy (re: back pain) please call clinic. Follow-up 6 months- lab appt, 12 months for complete physical.  Acute bronchitis Try to reduce-stop tobacco use- YOU CAN DO IT! Please take Azithromycin as directed.   Mixed hyperlipidemia Lipid Panel- Tot 260 TGs 269 HDL 53 LDL 153 ASCVD 5.9%, opt 0.6% age adjusted for 39 Recommend Mediterranean Diet and increasing regular exercise Recheck lipids in 6 months    FOLLOW-UP:  Return in about 1 year (around 10/15/2019) for CPE.

## 2018-10-14 ENCOUNTER — Ambulatory Visit (INDEPENDENT_AMBULATORY_CARE_PROVIDER_SITE_OTHER): Payer: 59 | Admitting: Adult Health

## 2018-10-14 ENCOUNTER — Encounter: Payer: Self-pay | Admitting: Adult Health

## 2018-10-14 VITALS — BP 125/82 | HR 95 | Temp 98.6°F | Ht 69.5 in | Wt 157.3 lb

## 2018-10-14 DIAGNOSIS — J40 Bronchitis, not specified as acute or chronic: Secondary | ICD-10-CM

## 2018-10-14 DIAGNOSIS — Z0000 Encounter for general adult medical examination without abnormal findings: Secondary | ICD-10-CM

## 2018-10-14 DIAGNOSIS — J209 Acute bronchitis, unspecified: Secondary | ICD-10-CM | POA: Diagnosis not present

## 2018-10-14 DIAGNOSIS — E782 Mixed hyperlipidemia: Secondary | ICD-10-CM

## 2018-10-14 MED ORDER — AZITHROMYCIN 250 MG PO TABS: ORAL_TABLET | ORAL | 0 refills | Status: DC

## 2018-10-14 NOTE — Assessment & Plan Note (Signed)
Lipid Panel- Tot 260 TGs 269 HDL 53 LDL 153 ASCVD 5.9%, opt 0.6% age adjusted for 39 Recommend Mediterranean Diet and increasing regular exercise Recheck lipids in 6 months

## 2018-10-14 NOTE — Assessment & Plan Note (Signed)
Continue to drink plenty of water and follow Mediterranean diet. Increase regular exercise. Please return in 6 months for fasting lab appt to re-check cholesterol levels- you can normalize with diet/exercise. Try to reduce-stop tobacco use- YOU CAN DO IT! Please take Azithromycin as directed. If your would life referral to Physical Therapy (re: back pain) please call clinic. Follow-up 6 months- lab appt, 12 months for complete physical.

## 2018-10-14 NOTE — Assessment & Plan Note (Signed)
Try to reduce-stop tobacco use- YOU CAN DO IT! Please take Azithromycin as directed.

## 2018-10-14 NOTE — Patient Instructions (Addendum)
Preventive Care for Adults, Male A healthy lifestyle and preventive care can promote health and wellness. Preventive health guidelines for men include the following key practices:  A routine yearly physical is a good way to check with your health care provider about your health and preventative screening. It is a chance to share any concerns and updates on your health and to receive a thorough exam.  Visit your dentist for a routine exam and preventative care every 6 months. Brush your teeth twice a day and floss once a day. Good oral hygiene prevents tooth decay and gum disease.  The frequency of eye exams is based on your age, health, family medical history, use of contact lenses, and other factors. Follow your health care provider's recommendations for frequency of eye exams.  Eat a healthy diet. Foods such as vegetables, fruits, whole grains, low-fat dairy products, and lean protein foods contain the nutrients you need without too many calories. Decrease your intake of foods high in solid fats, added sugars, and salt. Eat the right amount of calories for you.Get information about a proper diet from your health care provider, if necessary.  Regular physical exercise is one of the most important things you can do for your health. Most adults should get at least 150 minutes of moderate-intensity exercise (any activity that increases your heart rate and causes you to sweat) each week. In addition, most adults need muscle-strengthening exercises on 2 or more days a week.  Maintain a healthy weight. The body mass index (BMI) is a screening tool to identify possible weight problems. It provides an estimate of body fat based on height and weight. Your health care provider can find your BMI and can help you achieve or maintain a healthy weight.For adults 20 years and older:  A BMI below 18.5 is considered underweight.  A BMI of 18.5 to 24.9 is normal.  A BMI of 25 to 29.9 is considered  overweight.  A BMI of 30 and above is considered obese.  Maintain normal blood lipids and cholesterol levels by exercising and minimizing your intake of saturated fat. Eat a balanced diet with plenty of fruit and vegetables. Blood tests for lipids and cholesterol should begin at age 55 and be repeated every 5 years. If your lipid or cholesterol levels are high, you are over 50, or you are at high risk for heart disease, you may need your cholesterol levels checked more frequently.Ongoing high lipid and cholesterol levels should be treated with medicines if diet and exercise are not working.  If you smoke, find out from your health care provider how to quit. If you do not use tobacco, do not start.  Lung cancer screening is recommended for adults aged 90-80 years who are at high risk for developing lung cancer because of a history of smoking. A yearly low-dose CT scan of the lungs is recommended for people who have at least a 30-pack-year history of smoking and are a current smoker or have quit within the past 15 years. A pack year of smoking is smoking an average of 1 pack of cigarettes a day for 1 year (for example: 1 pack a day for 30 years or 2 packs a day for 15 years). Yearly screening should continue until the smoker has stopped smoking for at least 15 years. Yearly screening should be stopped for people who develop a health problem that would prevent them from having lung cancer treatment.  If you choose to drink alcohol, do not have more  than 2 drinks per day. One drink is considered to be 12 ounces (355 mL) of beer, 5 ounces (148 mL) of wine, or 1.5 ounces (44 mL) of liquor.  Avoid use of street drugs. Do not share needles with anyone. Ask for help if you need support or instructions about stopping the use of drugs.  High blood pressure causes heart disease and increases the risk of stroke. Your blood pressure should be checked at least every 1-2 years. Ongoing high blood pressure should be  treated with medicines, if weight loss and exercise are not effective.  If you are 34-90 years old, ask your health care provider if you should take aspirin to prevent heart disease.  Diabetes screening is done by taking a blood sample to check your blood glucose level after you have not eaten for a certain period of time (fasting). If you are not overweight and you do not have risk factors for diabetes, you should be screened once every 3 years starting at age 35. If you are overweight or obese and you are 70-84 years of age, you should be screened for diabetes every year as part of your cardiovascular risk assessment.  Colorectal cancer can be detected and often prevented. Most routine colorectal cancer screening begins at the age of 18 and continues through age 69. However, your health care provider may recommend screening at an earlier age if you have risk factors for colon cancer. On a yearly basis, your health care provider may provide home test kits to check for hidden blood in the stool. Use of a small camera at the end of a tube to directly examine the colon (sigmoidoscopy or colonoscopy) can detect the earliest forms of colorectal cancer. Talk to your health care provider about this at age 71, when routine screening begins. Direct exam of the colon should be repeated every 5-10 years through age 18, unless early forms of precancerous polyps or small growths are found.  People who are at an increased risk for hepatitis B should be screened for this virus. You are considered at high risk for hepatitis B if:  You were born in a country where hepatitis B occurs often. Talk with your health care provider about which countries are considered high risk.  Your parents were born in a high-risk country and you have not received a shot to protect against hepatitis B (hepatitis B vaccine).  You have HIV or AIDS.  You use needles to inject street drugs.  You live with, or have sex with, someone who  has hepatitis B.  You are a man who has sex with other men (MSM).  You get hemodialysis treatment.  You take certain medicines for conditions such as cancer, organ transplantation, and autoimmune conditions.  Hepatitis C blood testing is recommended for all people born from [birth year] through 1965 and any individual with known risks for hepatitis C.  Practice safe sex. Use condoms and avoid high-risk sexual practices to reduce the spread of sexually transmitted infections (STIs). STIs include gonorrhea, chlamydia, syphilis, trichomonas, herpes, HPV, and human immunodeficiency virus (HIV). Herpes, HIV, and HPV are viral illnesses that have no cure. They can result in disability, cancer, and death.  If you are a man who has sex with other men, you should be screened at least once per year for:  HIV.  Urethral, rectal, and pharyngeal infection of gonorrhea, chlamydia, or both.  If you are at risk of being infected with HIV, it is recommended that you take a  prescription medicine daily to prevent HIV infection. This is called preexposure prophylaxis (PrEP). You are considered at risk if:  You are a man who has sex with other men (MSM) and have other risk factors.  You are a heterosexual man, are sexually active, and are at increased risk for HIV infection.  You take drugs by injection.  You are sexually active with a partner who has HIV.  Talk with your health care provider about whether you are at high risk of being infected with HIV. If you choose to begin PrEP, you should first be tested for HIV. You should then be tested every 3 months for as long as you are taking PrEP.  A one-time screening for abdominal aortic aneurysm (AAA) and surgical repair of large AAAs by ultrasound are recommended for men ages 44 to 66 years who are current or former smokers.  Healthy men should no longer receive prostate-specific antigen (PSA) blood tests as part of routine cancer screening. Talk with your health  care provider about prostate cancer screening.  Testicular cancer screening is not recommended for adult males who have no symptoms. Screening includes self-exam, a health care provider exam, and other screening tests. Consult with your health care provider about any symptoms you have or any concerns you have about testicular cancer.  Use sunscreen. Apply sunscreen liberally and repeatedly throughout the day. You should seek shade when your shadow is shorter than you. Protect yourself by wearing long sleeves, pants, a wide-brimmed hat, and sunglasses year round, whenever you are outdoors.  Once a month, do a whole-body skin exam, using a mirror to look at the skin on your back. Tell your health care provider about new moles, moles that have irregular borders, moles that are larger than a pencil eraser, or moles that have changed in shape or color.  Stay current with required vaccines (immunizations).  Influenza vaccine. All adults should be immunized every year.  Tetanus, diphtheria, and acellular pertussis (Td, Tdap) vaccine. An adult who has not previously received Tdap or who does not know his vaccine status should receive 1 dose of Tdap. This initial dose should be followed by tetanus and diphtheria toxoids (Td) booster doses every 10 years. Adults with an unknown or incomplete history of completing a 3-dose immunization series with Td-containing vaccines should begin or complete a primary immunization series including a Tdap dose. Adults should receive a Td booster every 10 years.  Varicella vaccine. An adult without evidence of immunity to varicella should receive 2 doses or a second dose if he has previously received 1 dose.  Human papillomavirus (HPV) vaccine. Males aged 11-21 years who have not received the vaccine previously should receive the 3-dose series. Males aged 22-26 years may be immunized. Immunization is recommended through the age of 23 years for any male who has sex with males  and did not get any or all doses earlier. Immunization is recommended for any person with an immunocompromised condition through the age of 72 years if he did not get any or all doses earlier. During the 3-dose series, the second dose should be obtained 4-8 weeks after the first dose. The third dose should be obtained 24 weeks after the first dose and 16 weeks after the second dose.  Zoster vaccine. One dose is recommended for adults aged 23 years or older unless certain conditions are present.  Measles, mumps, and rubella (MMR) vaccine. Adults born before [birth year] generally are considered immune to measles and mumps. Adults born in [birth year]  or later should have 1 or more doses of MMR vaccine unless there is a contraindication to the vaccine or there is laboratory evidence of immunity to each of the three diseases. A routine second dose of MMR vaccine should be obtained at least 28 days after the first dose for students attending postsecondary schools, health care workers, or international travelers. People who received inactivated measles vaccine or an unknown type of measles vaccine during 1963-1967 should receive 2 doses of MMR vaccine. People who received inactivated mumps vaccine or an unknown type of mumps vaccine before 1979 and are at high risk for mumps infection should consider immunization with 2 doses of MMR vaccine. Unvaccinated health care workers born before [birth year] who lack laboratory evidence of measles, mumps, or rubella immunity or laboratory confirmation of disease should consider measles and mumps immunization with 2 doses of MMR vaccine or rubella immunization with 1 dose of MMR vaccine.  Pneumococcal 13-valent conjugate (PCV13) vaccine. When indicated, a person who is uncertain of his immunization history and has no record of immunization should receive the PCV13 vaccine. All adults 9 years of age and older should receive this vaccine. An adult aged 69 years or older who has certain medical  conditions and has not been previously immunized should receive 1 dose of PCV13 vaccine. This PCV13 should be followed with a dose of pneumococcal polysaccharide (PPSV23) vaccine. Adults who are at high risk for pneumococcal disease should obtain the PPSV23 vaccine at least 8 weeks after the dose of PCV13 vaccine. Adults older than 38 years of age who have normal immune system function should obtain the PPSV23 vaccine dose at least 1 year after the dose of PCV13 vaccine.  Pneumococcal polysaccharide (PPSV23) vaccine. When PCV13 is also indicated, PCV13 should be obtained first. All adults aged 79 years and older should be immunized. An adult younger than age 43 years who has certain medical conditions should be immunized. Any person who resides in a nursing home or long-term care facility should be immunized. An adult smoker should be immunized. People with an immunocompromised condition and certain other conditions should receive both PCV13 and PPSV23 vaccines. People with human immunodeficiency virus (HIV) infection should be immunized as soon as possible after diagnosis. Immunization during chemotherapy or radiation therapy should be avoided. Routine use of PPSV23 vaccine is not recommended for American Indians, [place] Natives, or people younger than 65 years unless there are medical conditions that require PPSV23 vaccine. When indicated, people who have unknown immunization and have no record of immunization should receive PPSV23 vaccine. One-time revaccination 5 years after the first dose of PPSV23 is recommended for people aged 19-64 years who have chronic kidney failure, nephrotic syndrome, asplenia, or immunocompromised conditions. People who received 1-2 doses of PPSV23 before age 70 years should receive another dose of PPSV23 vaccine at age 79 years or later if at least 5 years have passed since the previous dose. Doses of PPSV23 are not needed for people immunized with PPSV23 at or after age 55  years.  Meningococcal vaccine. Adults with asplenia or persistent complement component deficiencies should receive 2 doses of quadrivalent meningococcal conjugate (MenACWY-D) vaccine. The doses should be obtained at least 2 months apart. Microbiologists working with certain meningococcal bacteria, [employer] recruits, people at risk during an outbreak, and people who travel to or live in countries with a high rate of meningitis should be immunized. A first-year college student up through age 64 years who is living in a residence hall should receive a  dose if he did not receive a dose on or after his 16th birthday. Adults who have certain high-risk conditions should receive one or more doses of vaccine.  Hepatitis A vaccine. Adults who wish to be protected from this disease, have chronic liver disease, work with hepatitis A-infected animals, work in hepatitis A research labs, or travel to or work in countries with a high rate of hepatitis A should be immunized. Adults who were previously unvaccinated and who anticipate close contact with an international adoptee during the first 60 days after arrival in the [place] from a country with a high rate of hepatitis A should be immunized.  Hepatitis B vaccine. Adults should be immunized if they wish to be protected from this disease, are under age 34 years and have diabetes, have chronic liver disease, have had more than one sex partner in the past 6 months, may be exposed to blood or other infectious body fluids, are household contacts or sex partners of hepatitis B positive people, are clients or workers in certain care facilities, or travel to or work in countries with a high rate of hepatitis B.  Haemophilus influenzae type b (Hib) vaccine. A previously unvaccinated person with asplenia or sickle cell disease or having a scheduled splenectomy should receive 1 dose of Hib vaccine. Regardless of previous immunization, a recipient of a hematopoietic stem cell  transplant should receive a 3-dose series 6-12 months after his successful transplant. Hib vaccine is not recommended for adults with HIV infection. Preventive Service / Frequency Ages 77 to 55  Blood pressure check.** / Every 3-5 years.  Lipid and cholesterol check.** / Every 5 years beginning at age 66.  Hepatitis C blood test.** / For any individual with known risks for hepatitis C.  Skin self-exam. / Monthly.  Influenza vaccine. / Every year.  Tetanus, diphtheria, and acellular pertussis (Tdap, Td) vaccine.** / Consult your health care provider. 1 dose of Td every 10 years.  Varicella vaccine.** / Consult your health care provider.  HPV vaccine. / 3 doses over 6 months, if 45 or younger.  Measles, mumps, rubella (MMR) vaccine.** / You need at least 1 dose of MMR if you were born in 1957 or later. You may also need a second dose.  Pneumococcal 13-valent conjugate (PCV13) vaccine.** / Consult your health care provider.  Pneumococcal polysaccharide (PPSV23) vaccine.** / 1 to 2 doses if you smoke cigarettes or if you have certain conditions.  Meningococcal vaccine.** / 1 dose if you are age 81 to 79 years and a Market researcher living in a residence hall, or have one of several medical conditions. You may also need additional booster doses.  Hepatitis A vaccine.** / Consult your health care provider.  Hepatitis B vaccine.** / Consult your health care provider.  Haemophilus influenzae type b (Hib) vaccine.** / Consult your health care provider. Ages 6 to 58  Blood pressure check.** / Every year.  Lipid and cholesterol check.** / Every 5 years beginning at age 89.  Lung cancer screening. / Every year if you are aged 84-80 years and have a 30-pack-year history of smoking and currently smoke or have quit within the past 15 years. Yearly screening is stopped once you have quit smoking for at least 15 years or develop a health problem that would prevent you from having  lung cancer treatment.  Fecal occult blood test (FOBT) of stool. / Every year beginning at age 90 and continuing until age 73. You may not have to do  this test if you get a colonoscopy every 10 years.  Flexible sigmoidoscopy** or colonoscopy.** / Every 5 years for a flexible sigmoidoscopy or every 10 years for a colonoscopy beginning at age 50 and continuing until age 75.  Hepatitis C blood test.** / For all people born from 1945 through 1965 and any individual with known risks for hepatitis C.  Skin self-exam. / Monthly.  Influenza vaccine. / Every year.  Tetanus, diphtheria, and acellular pertussis (Tdap/Td) vaccine.** / Consult your health care provider. 1 dose of Td every 10 years.  Varicella vaccine.** / Consult your health care provider.  Zoster vaccine.** / 1 dose for adults aged 60 years or older.  Measles, mumps, rubella (MMR) vaccine.** / You need at least 1 dose of MMR if you were born in 1957 or later. You may also need a second dose.  Pneumococcal 13-valent conjugate (PCV13) vaccine.** / Consult your health care provider.  Pneumococcal polysaccharide (PPSV23) vaccine.** / 1 to 2 doses if you smoke cigarettes or if you have certain conditions.  Meningococcal vaccine.** / Consult your health care provider.  Hepatitis A vaccine.** / Consult your health care provider.  Hepatitis B vaccine.** / Consult your health care provider.  Haemophilus influenzae type b (Hib) vaccine.** / Consult your health care provider. Ages 65 and over  Blood pressure check.** / Every year.  Lipid and cholesterol check.**/ Every 5 years beginning at age 20.  Lung cancer screening. / Every year if you are aged 55-80 years and have a 30-pack-year history of smoking and currently smoke or have quit within the past 15 years. Yearly screening is stopped once you have quit smoking for at least 15 years or develop a health problem that would prevent you from having lung cancer treatment.  Fecal  occult blood test (FOBT) of stool. / Every year beginning at age 50 and continuing until age 75. You may not have to do this test if you get a colonoscopy every 10 years.  Flexible sigmoidoscopy** or colonoscopy.** / Every 5 years for a flexible sigmoidoscopy or every 10 years for a colonoscopy beginning at age 50 and continuing until age 75.  Hepatitis C blood test.** / For all people born from 1945 through 1965 and any individual with known risks for hepatitis C.  Abdominal aortic aneurysm (AAA) screening.** / A one-time screening for ages 65 to 75 years who are current or former smokers.  Skin self-exam. / Monthly.  Influenza vaccine. / Every year.  Tetanus, diphtheria, and acellular pertussis (Tdap/Td) vaccine.** / 1 dose of Td every 10 years.  Varicella vaccine.** / Consult your health care provider.  Zoster vaccine.** / 1 dose for adults aged 60 years or older.  Pneumococcal 13-valent conjugate (PCV13) vaccine.** / 1 dose for all adults aged 65 years and older.  Pneumococcal polysaccharide (PPSV23) vaccine.** / 1 dose for all adults aged 65 years and older.  Meningococcal vaccine.** / Consult your health care provider.  Hepatitis A vaccine.** / Consult your health care provider.  Hepatitis B vaccine.** / Consult your health care provider.  Haemophilus influenzae type b (Hib) vaccine.** / Consult your health care provider. **Family history and personal history of risk and conditions may change your health care provider's recommendations.   This information is not intended to replace advice given to you by your health care provider. Make sure you discuss any questions you have with your health care provider.   Document Released: 01/09/2002 Document Revised: 12/04/2014 Document Reviewed: 04/10/2011 Elsevier Interactive Patient Education 2016   [place] refers to food and lifestyle choices that are based on the traditions of  countries located on the [employer]. This way of eating has been shown to help prevent certain conditions and improve outcomes for people who have chronic diseases, like kidney disease and heart disease. What are tips for following this plan? Lifestyle  Cook and eat meals together with your family, when possible.  Drink enough fluid to keep your urine clear or pale yellow.  Be physically active every day. This includes: ? Aerobic exercise like running or swimming. ? Leisure activities like gardening, walking, or housework.  Get 7-8 hours of sleep each night.  If recommended by your health care provider, drink red wine in moderation. This means 1 glass a day for nonpregnant women and 2 glasses a day for men. A glass of wine equals 5 oz (150 mL). Reading food labels  Check the serving size of packaged foods. For foods such as rice and pasta, the serving size refers to the amount of cooked product, not dry.  Check the total fat in packaged foods. Avoid foods that have saturated fat or trans fats.  Check the ingredients list for added sugars, such as corn syrup. Shopping  At the grocery store, buy most of your food from the areas near the walls of the store. This includes: ? Fresh fruits and vegetables (produce). ? Grains, beans, nuts, and seeds. Some of these may be available in unpackaged forms or large amounts (in bulk). ? Fresh seafood. ? Poultry and eggs. ? Low-fat dairy products.  Buy whole ingredients instead of prepackaged foods.  Buy fresh fruits and vegetables in-season from local farmers markets.  Buy frozen fruits and vegetables in resealable bags.  If you do not have access to quality fresh seafood, buy precooked frozen shrimp or canned fish, such as tuna, salmon, or sardines.  Buy small amounts of raw or cooked vegetables, salads, or olives from the deli or salad bar at your store.  Stock your pantry so you always have certain foods on hand, such as olive  oil, canned tuna, canned tomatoes, rice, pasta, and beans. Cooking  Cook foods with extra-virgin olive oil instead of using butter or other vegetable oils.  Have meat as a side dish, and have vegetables or grains as your main dish. This means having meat in small portions or adding small amounts of meat to foods like pasta or stew.  Use beans or vegetables instead of meat in common dishes like chili or lasagna.  Experiment with different cooking methods. Try roasting or broiling vegetables instead of steaming or sauteing them.  Add frozen vegetables to soups, stews, pasta, or rice.  Add nuts or seeds for added healthy fat at each meal. You can add these to yogurt, salads, or vegetable dishes.  Marinate fish or vegetables using olive oil, lemon juice, garlic, and fresh herbs. Meal planning  Plan to eat 1 vegetarian meal one day each week. Try to work up to 2 vegetarian meals, if possible.  Eat seafood 2 or more times a week.  Have healthy snacks readily available, such as: ? Vegetable sticks with hummus. ? [place] yogurt. ? Fruit and nut trail mix.  Eat balanced meals throughout the week. This includes: ? Fruit: 2-3 servings a day ? Vegetables: 4-5 servings a day ? Low-fat dairy: 2 servings a day ? Fish, poultry, or lean meat: 1 serving a day ? Beans and legumes: 2 or more  servings a week ? Nuts and seeds: 1-2 servings a day ? Whole grains: 6-8 servings a day ? Extra-virgin olive oil: 3-4 servings a day  Limit red meat and sweets to only a few servings a month What are my food choices?  Mediterranean diet ? Recommended ? Grains: Whole-grain pasta. Brown rice. Bulgar wheat. Polenta. Couscous. Whole-wheat bread. [place]. ? Vegetables: Artichokes. Beets. Broccoli. Cabbage. Carrots. Eggplant. Green beans. Chard. Kale. Spinach. Onions. Leeks. Peas. Squash. Tomatoes. Peppers. Radishes. ? Fruits: Apples. Apricots. Avocado. Berries. Bananas. Cherries. Dates. Figs. Grapes.  Lemons. Melon. Oranges. Peaches. Plums. Pomegranate. ? Meats and other protein foods: Beans. Almonds. Sunflower seeds. Pine nuts. Peanuts. [place]. Salmon. Scall[place]. Shrimp. [place]. Tilapia. Clams. Oysters. Eggs. ? Dairy: Low-fat milk. Cheese. Greek yogurt. ? Beverages: Water. Red wine. Herbal tea. ? Fats and oils: Extra virgin olive oil. Avocado oil. Grape seed oil. ? Sweets and desserts: [place] yogurt with honey. Baked apples. Poached pears. Trail mix. ? Seasoning and other foods: Basil. Cilantro. Coriander. Cumin. Mint. Parsley. Sage. Rosemary. Tarragon. Garlic. Oregano. Thyme. Pepper. Balsalmic vinegar. Tahini. Hummus. Tomato sauce. Olives. Mushrooms. ? Limit these ? Grains: Prepackaged pasta or rice dishes. Prepackaged cereal with added sugar. ? Vegetables: Deep fried potatoes (french fries). ? Fruits: Fruit canned in syrup. ? Meats and other protein foods: Beef. Pork. Lamb. Poultry with skin. Hot dogs. [place] Sal[place]. ? Dairy: Ice cream. Sour cream. Whole milk. ? Beverages: Juice. Sugar-sweetened soft drinks. Beer. Liquor and spirits. ? Fats and oils: Butter. Canola oil. Vegetable oil. Beef fat (tallow). Lard. ? Sweets and desserts: Cookies. Cakes. Pies. Candy. ? Seasoning and other foods: Mayonnaise. Premade sauces and marinades. ? The items listed may not be a complete list. Talk with your dietitian about what dietary choices are right for you. Summary  The Mediterranean diet includes both food and lifestyle choices.  Eat a variety of fresh fruits and vegetables, beans, nuts, seeds, and whole grains.  Limit the amount of red meat and sweets that you eat.  Talk with your health care provider about whether it is safe for you to drink red wine in moderation. This means 1 glass a day for nonpregnant women and 2 glasses a day for men. A glass of wine equals 5 oz (150 mL). This information is not intended to replace advice given to you by your health care provider. Make sure you discuss any questions  you have with your health care provider. Document Released: 07/06/2016 Document Revised: 08/08/2016 Document Reviewed: 07/06/2016 Elsevier Interactive Patient Education  2018 [place] to drink plenty of water and follow Mediterranean diet. Increase regular exercise. Please return in 6 months for fasting lab appt to re-check cholesterol levels- you can normalize with diet/exercise. Try to reduce-stop tobacco use- YOU CAN DO IT! Please take Azithromycin as directed. If your would life referral to Physical Therapy (re: back pain) please call clinic. Follow-up 6 months- lab appt, 12 months for complete physical. NICE TO SEE YOU!

## 2019-09-15 ENCOUNTER — Telehealth: Payer: Self-pay | Admitting: Adult Health

## 2019-09-15 ENCOUNTER — Encounter: Payer: Self-pay | Admitting: Adult Health

## 2019-09-15 ENCOUNTER — Other Ambulatory Visit: Payer: Self-pay

## 2019-09-15 ENCOUNTER — Ambulatory Visit (INDEPENDENT_AMBULATORY_CARE_PROVIDER_SITE_OTHER): Payer: 59 | Admitting: Adult Health

## 2019-09-15 DIAGNOSIS — R8290 Unspecified abnormal findings in urine: Secondary | ICD-10-CM | POA: Diagnosis not present

## 2019-09-15 DIAGNOSIS — R310 Gross hematuria: Secondary | ICD-10-CM

## 2019-09-15 LAB — POCT URINALYSIS DIPSTICK
Bilirubin, UA: NEGATIVE
Glucose, UA: NEGATIVE
Ketones, UA: NEGATIVE
Nitrite, UA: NEGATIVE
Protein, UA: POSITIVE — AB
Spec Grav, UA: 1.025 (ref 1.010–1.025)
Urobilinogen, UA: 1 E.U./dL
pH, UA: 7 (ref 5.0–8.0)

## 2019-09-15 MED ORDER — NITROFURANTOIN MONOHYD MACRO 100 MG PO CAPS: 100.0000 mg | ORAL_CAPSULE | Freq: Two times a day (BID) | ORAL | 0 refills | Status: DC

## 2019-09-15 NOTE — Assessment & Plan Note (Signed)
Assessment and Plan: UA- Blood- Mod Nitrites-Neg Leu- Trace With sx's will start on Nitrofurantoin 100mg  BID x 7 days Urine sent for C/S- will call when results are available. Remain well hydrated. Encouraged to reduce to stop tobacco use- declined tobacco cessation today.  Follow Up Instructions: Instructed to schedule OV with fasting labs 1-2 weeks to address elevated BP Discussed red flag cardiac sx's and to seek immediate medical assistance if he experiences any- he verbalized understanding/agreement   I discussed the assessment and treatment plan with the patient. The patient was provided an opportunity to ask questions and all were answered. The patient agreed with the plan and demonstrated an understanding of the instructions.   The patient was advised to call back or seek an in-person evaluation if the symptoms worsen or if the condition fails to improve as anticipated.  I provided 15  minutes of non-face-to-face time during this encounter.

## 2019-09-15 NOTE — Progress Notes (Signed)
Virtual Visit via Telephone Note  I connected with Wesley Gross on 09/15/19 at  3:00 PM EDT by telephone and verified that I am speaking with the correct person using two identifiers.  Location: Patient: Home Provider: In Clinic   I discussed the limitations, risks, security and privacy concerns of performing an evaluation and management service by telephone and the availability of in person appointments. I also discussed with the patient that there may be a patient responsible charge related to this service. The patient expressed understanding and agreed to proceed.   History of Present Illness: Mr. Wesley Gross calls in with "some very minor lower abdominal pressure", sensation is constant. He also reports hematuria, urinary urgency, minor dyusria however denies frequency- all sx's present for 24 hrs. He denies flank pain, he has chronic lumbar back pain w/o sciatica. He denies fever. He denies N/V/C/D He reports having a UTI at age 38, denies any adulthood infection. He estimates to drink 6 x 12 oz water bottles/day. He is smoking- 2 packs/day, he declined smoking cessation today. He denies blunt trauma to back/abdomen. He denies new sexual partners. He reports recent OV with chiropractor and being told of elevated BP- 160/110- advised to schedule OV with fasting labs in 1-2 weeks to address.  09/2018 CMP GFR 90  Patient Care Team    Relationship Specialty Notifications Start End  [name], NP PCP - General Family Medicine  07/08/18     Patient Active Problem List   Diagnosis Date Noted  . Mixed hyperlipidemia 10/14/2018  . Acute bilateral low back pain without sciatica 10/01/2018  . Healthcare maintenance 07/08/2018  . Plantar warts 07/08/2018  . Tobacco use disorder 07/08/2018  . Acute bronchitis 10/12/2017  . Pleurisy 10/12/2017  . Wheezing 10/12/2017  . Smoker 10/12/2017     Past Medical History:  Diagnosis Date  . Hypertension      History reviewed. No  pertinent surgical history.   Family History  Problem Relation Age of Onset  . Hyperlipidemia Mother   . Hypertension Mother      Social History   Substance and Sexual Activity  Drug Use No     Social History   Substance and Sexual Activity  Alcohol Use Yes  . Alcohol/week: 3.0 standard drinks  . Types: 3 Cans of beer per week  . Frequency: Never     Social History   Tobacco Use  Smoking Status Current Every Day Smoker  . Packs/day: 2.00  . Years: 22.00  . Pack years: 44.00  Smokeless Tobacco Never Used     Outpatient Encounter Medications as of 09/15/2019  Medication Sig  . [DISCONTINUED] azithromycin (ZITHROMAX) 250 MG tablet 2 tabs day one. 1 tab days two- five   No facility-administered encounter medications on file as of 09/15/2019.     Allergies: Cephalexin  There is no height or weight on file to calculate BMI.  There were no vitals taken for this visit. Review of Systems: General:   Denies fever, chills, unexplained weight loss.  Optho/Auditory:   Denies visual changes, blurred vision/LOV Respiratory:   Denies SOB, DOE more than baseline levels.  Cardiovascular:   Denies chest pain, palpitations, new onset peripheral edema  Gastrointestinal:   Denies nausea, vomiting, diarrhea.  Genitourinary: Dysuria, freq/ urgency + Denies flank pain or discharge from genitals.  Endocrine: Denies hot or cold intolerance, polyuria, polydipsia. Musculoskeletal:   Denies unexplained myalgias, joint swelling, unexplained arthralgias, gait problems.  Skin:  Denies rash, suspicious lesions Neurological:  Denies dizziness, unexplained weakness, numbness  Psychiatric/Behavioral:   Denies mood changes, suicidal or homicidal ideations, hallucinations This patient does not have sx concerning for COVID-19 Infection (ie; fever, chills, cough, new or worsening shortness of breath).  Observations/Objective: No acute distress noted during the telephone  conversation   Assessment and Plan: UA- Blood- Mod Nitrites-Neg Leu- Trace With sx's will start on Nitrofurantoin 100mg  BID x 7 days Urine sent for C/S- will call when results are available. Remain well hydrated. Encouraged to reduce to stop tobacco use- declined tobacco cessation today.  Follow Up Instructions: Instructed to schedule OV with fasting labs 1-2 weeks to address elevated BP Discussed red flag cardiac sx's and to seek immediate medical assistance if he experiences any- he verbalized understanding/agreement   I discussed the assessment and treatment plan with the patient. The patient was provided an opportunity to ask questions and all were answered. The patient agreed with the plan and demonstrated an understanding of the instructions.   The patient was advised to call back or seek an in-person evaluation if the symptoms worsen or if the condition fails to improve as anticipated.  I provided 15  minutes of non-face-to-face time during this encounter.   , NP

## 2019-09-15 NOTE — Telephone Encounter (Signed)
Patient call states has had blood in urine since yesterday , he tried to get appt w/ Urologist but couldn't for 2 wks---   Advised pt would send message to medical asst & that a Urine sample would be needed & a poss Telehealth appt.  --glh

## 2019-09-15 NOTE — Telephone Encounter (Signed)
Pt has appt today.  Charyl Bigger, CMA

## 2019-09-19 ENCOUNTER — Other Ambulatory Visit: Payer: Self-pay | Admitting: Adult Health

## 2019-09-19 LAB — CULTURE, URINE COMPREHENSIVE

## 2019-09-29 ENCOUNTER — Encounter: Payer: Self-pay | Admitting: Adult Health

## 2019-09-29 ENCOUNTER — Other Ambulatory Visit: Payer: Self-pay

## 2019-09-29 ENCOUNTER — Other Ambulatory Visit: Payer: 59

## 2019-09-29 DIAGNOSIS — Z0000 Encounter for general adult medical examination without abnormal findings: Secondary | ICD-10-CM

## 2019-09-29 DIAGNOSIS — E782 Mixed hyperlipidemia: Secondary | ICD-10-CM

## 2019-09-29 DIAGNOSIS — F17200 Nicotine dependence, unspecified, uncomplicated: Secondary | ICD-10-CM

## 2019-09-30 LAB — CBC WITH DIFFERENTIAL/PLATELET
Basophils Absolute: 0 10*3/uL (ref 0.0–0.2)
Basos: 1 %
EOS (ABSOLUTE): 0.1 10*3/uL (ref 0.0–0.4)
Eos: 2 %
Hematocrit: 45.9 % (ref 37.5–51.0)
Hemoglobin: 16.5 g/dL (ref 13.0–17.7)
Immature Grans (Abs): 0 10*3/uL (ref 0.0–0.1)
Immature Granulocytes: 0 %
Lymphocytes Absolute: 1.4 10*3/uL (ref 0.7–3.1)
Lymphs: 24 %
MCH: 35.5 pg — ABNORMAL HIGH (ref 26.6–33.0)
MCHC: 35.9 g/dL — ABNORMAL HIGH (ref 31.5–35.7)
MCV: 99 fL — ABNORMAL HIGH (ref 79–97)
Monocytes Absolute: 0.5 10*3/uL (ref 0.1–0.9)
Monocytes: 8 %
Neutrophils Absolute: 3.8 10*3/uL (ref 1.4–7.0)
Neutrophils: 65 %
Platelets: 212 10*3/uL (ref 150–450)
RBC: 4.65 x10E6/uL (ref 4.14–5.80)
RDW: 11.8 % (ref 11.6–15.4)
WBC: 5.9 10*3/uL (ref 3.4–10.8)

## 2019-09-30 LAB — COMPREHENSIVE METABOLIC PANEL
ALT: 10 IU/L (ref 0–44)
AST: 12 IU/L (ref 0–40)
Albumin/Globulin Ratio: 2 (ref 1.2–2.2)
Albumin: 4.6 g/dL (ref 4.0–5.0)
Alkaline Phosphatase: 74 IU/L (ref 39–117)
BUN/Creatinine Ratio: 14 (ref 9–20)
BUN: 16 mg/dL (ref 6–20)
Bilirubin Total: 0.6 mg/dL (ref 0.0–1.2)
CO2: 23 mmol/L (ref 20–29)
Calcium: 9.3 mg/dL (ref 8.7–10.2)
Chloride: 101 mmol/L (ref 96–106)
Creatinine, Ser: 1.14 mg/dL (ref 0.76–1.27)
GFR calc Af Amer: 93 mL/min/{1.73_m2} (ref >59)
GFR calc non Af Amer: 81 mL/min/{1.73_m2} (ref >59)
Globulin, Total: 2.3 g/dL (ref 1.5–4.5)
Glucose: 105 mg/dL — ABNORMAL HIGH (ref 65–99)
Potassium: 4 mmol/L (ref 3.5–5.2)
Sodium: 137 mmol/L (ref 134–144)
Total Protein: 6.9 g/dL (ref 6.0–8.5)

## 2019-09-30 LAB — HEMOGLOBIN A1C
Est. average glucose Bld gHb Est-mCnc: 103 mg/dL
Hgb A1c MFr Bld: 5.2 % (ref 4.8–5.6)

## 2019-09-30 LAB — LIPID PANEL
Chol/HDL Ratio: 5.2 ratio — ABNORMAL HIGH (ref 0.0–5.0)
Cholesterol, Total: 235 mg/dL — ABNORMAL HIGH (ref 100–199)
HDL: 45 mg/dL (ref >39)
LDL Chol Calc (NIH): 163 mg/dL — ABNORMAL HIGH (ref 0–99)
Triglycerides: 150 mg/dL — ABNORMAL HIGH (ref 0–149)
VLDL Cholesterol Cal: 27 mg/dL (ref 5–40)

## 2019-09-30 LAB — TSH: TSH: 2.11 u[IU]/mL (ref 0.450–4.500)

## 2019-10-02 ENCOUNTER — Other Ambulatory Visit: Payer: Self-pay | Admitting: Adult Health

## 2019-10-02 DIAGNOSIS — Z79899 Other long term (current) drug therapy: Secondary | ICD-10-CM

## 2019-10-02 DIAGNOSIS — E782 Mixed hyperlipidemia: Secondary | ICD-10-CM

## 2019-10-02 MED ORDER — ATORVASTATIN CALCIUM 20 MG PO TABS: 20.0000 mg | ORAL_TABLET | Freq: Every day | ORAL | 1 refills | Status: DC

## 2019-10-13 ENCOUNTER — Other Ambulatory Visit: Payer: Self-pay

## 2019-10-13 DIAGNOSIS — Z20822 Contact with and (suspected) exposure to covid-19: Secondary | ICD-10-CM

## 2019-10-15 LAB — NOVEL CORONAVIRUS, NAA: SARS-CoV-2, NAA: NOT DETECTED

## 2019-11-03 ENCOUNTER — Ambulatory Visit (INDEPENDENT_AMBULATORY_CARE_PROVIDER_SITE_OTHER): Payer: 59 | Admitting: Adult Health

## 2019-11-03 ENCOUNTER — Encounter: Payer: Self-pay | Admitting: Adult Health

## 2019-11-03 ENCOUNTER — Other Ambulatory Visit: Payer: Self-pay

## 2019-11-03 DIAGNOSIS — E782 Mixed hyperlipidemia: Secondary | ICD-10-CM | POA: Diagnosis not present

## 2019-11-03 DIAGNOSIS — I10 Essential (primary) hypertension: Secondary | ICD-10-CM | POA: Insufficient documentation

## 2019-11-03 DIAGNOSIS — Z0000 Encounter for general adult medical examination without abnormal findings: Secondary | ICD-10-CM

## 2019-11-03 MED ORDER — AMLODIPINE BESYLATE-VALSARTAN 5-160 MG PO TABS: 1.0000 | ORAL_TABLET | Freq: Every day | ORAL | 0 refills | Status: DC

## 2019-11-03 NOTE — Progress Notes (Signed)
Subjective:    Patient ID: Wesley Gross, male    DOB: 02/23/1981, 38 y.o.   MRN: [id number]  HPI:  [name] is here for CPE. He reports compliance on Atorvastatin 61m QD- denies myalgia's. Lipids/Hepatci fx panel will be re-checked next week He report ambulatory BP SBP 140-160s DBP 80-100s HR 60-80s He denies acute cardiac sx's He was previously on Amlodipine/Valsartan 5/160mg  QD-tolerated well and has been off >12 months He continues to smoke 2 packs/day- open to "thinking about tobacco cessation" after the new year. He has tried Nicoderm and Chantix in past- re-started tobacco after each therapy.  09/29/2019 Labs: TSH-WNL, 2.110  A1c-WML, 5.2  CMP-stable  CBC-stable  Lipid Panel-  Total-235  TGs-150  HDL-45  LDL-163, increased from 153  He was started on Atorvastatin 20mg  QD- needs hepatic fx/lipids re-checked end of Dec 2020  Healthcare Maintenance: Immunizations-UTD  Patient Care Team    Relationship Specialty Notifications Start End  [name], NP PCP - General Family Medicine  07/08/18     Patient Active Problem List   Diagnosis Date Noted  . HTN, goal below 130/80 11/03/2019  . Abnormal urinalysis 09/15/2019  . Mixed hyperlipidemia 10/14/2018  . Acute bilateral low back pain without sciatica 10/01/2018  . Healthcare maintenance 07/08/2018  . Plantar warts 07/08/2018  . Tobacco use disorder 07/08/2018  . Acute bronchitis 10/12/2017  . Pleurisy 10/12/2017  . Wheezing 10/12/2017  . Smoker 10/12/2017     Past Medical History:  Diagnosis Date  . Chronic kidney disease    kidney stones  . Hypertension      History reviewed. No pertinent surgical history.   Family History  Problem Relation Age of Onset  . Hyperlipidemia Mother   . Hypertension Mother      Social History   Substance and Sexual Activity  Drug Use No     Social History   Substance and Sexual Activity  Alcohol Use Yes  . Alcohol/week: 3.0 standard drinks  . Types: 3  Cans of beer per week  . Frequency: Never     Social History   Tobacco Use  Smoking Status Current Every Day Smoker  . Packs/day: 2.00  . Years: 22.00  . Pack years: 44.00  Smokeless Tobacco Never Used     Outpatient Encounter Medications as of 11/03/2019  Medication Sig  . atorvastatin (LIPITOR) 20 MG tablet Take 1 tablet (20 mg total) by mouth daily.  Marland Kitchen amLODipine-valsartan (EXFORGE) 5-160 MG tablet Take 1 tablet by mouth daily.   No facility-administered encounter medications on file as of 11/03/2019.     Allergies: Cephalexin  Body mass index is 23.35 kg/m.  Blood pressure (!) 143/87, pulse 66, temperature 98.1 F (36.7 C), temperature source Oral, height 5' 7.5" (1.715 m), weight 151 lb 4.8 oz (68.6 kg), SpO2 99 %.     Review of Systems  Constitutional: Positive for fatigue. Negative for activity change, appetite change, chills, diaphoresis, fever and unexpected weight change.  HENT: Positive for congestion.   Eyes: Negative for visual disturbance.  Respiratory: Negative for cough, chest tightness, shortness of breath, wheezing and stridor.   Cardiovascular: Negative for chest pain, palpitations and leg swelling.  Gastrointestinal: Negative for abdominal distention, anal bleeding, blood in stool, constipation, diarrhea, nausea and vomiting.  Genitourinary: Positive for difficulty urinating and hematuria.       Hx of nephrolithiasis - followed by Urology, recently passed stone a few weeks ago  Musculoskeletal: Positive for arthralgias and back pain. Negative for  myalgias.  Neurological: Negative for dizziness and headaches.  Hematological: Negative for adenopathy. Does not bruise/bleed easily.  Psychiatric/Behavioral: Negative for agitation, behavioral problems, confusion, decreased concentration, dysphoric mood, hallucinations, self-injury, sleep disturbance and suicidal ideas. The patient is not nervous/anxious and is not hyperactive.        Objective:    Physical Exam Vitals signs and nursing note reviewed.  Constitutional:      General: He is not in acute distress.    Appearance: Normal appearance. He is normal weight. He is not ill-appearing, toxic-appearing or diaphoretic.  HENT:     Head: Normocephalic and atraumatic.     Right Ear: Tympanic membrane, ear canal and external ear normal.     Left Ear: Tympanic membrane, ear canal and external ear normal.     Nose: Nose normal. No congestion.     Mouth/Throat:     Mouth: Mucous membranes are moist.     Pharynx: No oropharyngeal exudate.  Eyes:     Pupils: Pupils are equal, round, and reactive to light.  Neck:     Musculoskeletal: Normal range of motion and neck supple.  Cardiovascular:     Rate and Rhythm: Normal rate and regular rhythm.     Pulses: Normal pulses.     Heart sounds: Normal heart sounds. No murmur. No friction rub. No gallop.   Pulmonary:     Effort: Pulmonary effort is normal. No respiratory distress.     Breath sounds: Normal breath sounds. No wheezing, rhonchi or rales.  Chest:     Chest wall: No tenderness.  Abdominal:     General: Abdomen is flat. There is no distension.     Palpations: Abdomen is soft. There is no mass.     Tenderness: There is no abdominal tenderness. There is no guarding or rebound.     Hernia: No hernia is present.  Musculoskeletal: Normal range of motion.        General: No tenderness.  Skin:    General: Skin is warm and dry.     Capillary Refill: Capillary refill takes less than 2 seconds.  Neurological:     Mental Status: He is alert and oriented to person, place, and time.     Coordination: Coordination normal.  Psychiatric:        Mood and Affect: Mood normal.        Thought Content: Thought content normal.        Judgment: Judgment normal.       Assessment & Plan:   1. Healthcare maintenance   2. HTN, goal below 130/80   3. Mixed hyperlipidemia     Healthcare maintenance Re-start Amlodipine/Valsartan 5/160mg  once  daily. Please check blood pressure (BP), heart rate daily- record. Call clinic if BP consistently <100/60 or >140/90. We will call contact you when lab results. Please schedule TeleMedicine in 4 weeks- BP and tobacco cessation. Follow heart healthy diet, increase plain water intake. Continue to social distance and wear a mask when in public.  HTN, goal below 130/80 Re-start Amlodipine/Valsartan 5/160mg  once daily. Please check blood pressure (BP), heart rate daily- record. Call clinic if BP consistently <100/60 or >140/90. 09/29/2019 CMP-stable Please schedule TeleMedicine in 4 weeks- BP and tobacco cessation.   Mixed hyperlipidemia On Atorvastatin 62m QD- denies myalgia's. Lipids/Hepatci fx panel will be re-checked next week    FOLLOW-UP:  Return in about 4 weeks (around 12/01/2019) for Regular Follow Up, HTN, Evaluate Medication Effectiveness.

## 2019-11-03 NOTE — Assessment & Plan Note (Signed)
Re-start Amlodipine/Valsartan 5/160mg  once daily. Please check blood pressure (BP), heart rate daily- record. Call clinic if BP consistently <100/60 or >140/90. We will call contact you when lab results. Please schedule TeleMedicine in 4 weeks- BP and tobacco cessation. Follow heart healthy diet, increase plain water intake. Continue to social distance and wear a mask when in public.

## 2019-11-03 NOTE — Assessment & Plan Note (Signed)
Re-start Amlodipine/Valsartan 5/160mg  once daily. Please check blood pressure (BP), heart rate daily- record. Call clinic if BP consistently <100/60 or >140/90. 09/29/2019 CMP-stable Please schedule TeleMedicine in 4 weeks- BP and tobacco cessation.

## 2019-11-03 NOTE — Assessment & Plan Note (Signed)
On Atorvastatin 67m QD- denies myalgia's. Lipids/Hepatci fx panel will be re-checked next week

## 2019-11-03 NOTE — Patient Instructions (Addendum)

## 2019-11-10 ENCOUNTER — Other Ambulatory Visit: Payer: Self-pay

## 2019-11-10 ENCOUNTER — Other Ambulatory Visit: Payer: 59

## 2019-11-10 DIAGNOSIS — Z79899 Other long term (current) drug therapy: Secondary | ICD-10-CM

## 2019-11-10 DIAGNOSIS — E782 Mixed hyperlipidemia: Secondary | ICD-10-CM

## 2019-11-11 ENCOUNTER — Encounter: Payer: Self-pay | Admitting: Adult Health

## 2019-11-11 LAB — HEPATIC FUNCTION PANEL
ALT: 17 IU/L (ref 0–44)
AST: 21 IU/L (ref 0–40)
Albumin: 5 g/dL (ref 4.0–5.0)
Alkaline Phosphatase: 77 IU/L (ref 39–117)
Bilirubin Total: 0.6 mg/dL (ref 0.0–1.2)
Bilirubin, Direct: 0.2 mg/dL (ref 0.00–0.40)
Total Protein: 7 g/dL (ref 6.0–8.5)

## 2019-11-11 LAB — LIPID PANEL
Chol/HDL Ratio: 3.8 ratio (ref 0.0–5.0)
Cholesterol, Total: 173 mg/dL (ref 100–199)
HDL: 45 mg/dL (ref >39)
LDL Chol Calc (NIH): 103 mg/dL — ABNORMAL HIGH (ref 0–99)
Triglycerides: 143 mg/dL (ref 0–149)
VLDL Cholesterol Cal: 25 mg/dL (ref 5–40)

## 2019-12-01 ENCOUNTER — Other Ambulatory Visit: Payer: Self-pay

## 2019-12-01 ENCOUNTER — Telehealth: Payer: Self-pay | Admitting: *Deleted

## 2019-12-01 ENCOUNTER — Ambulatory Visit (INDEPENDENT_AMBULATORY_CARE_PROVIDER_SITE_OTHER): Payer: 59 | Admitting: Adult Health

## 2019-12-01 DIAGNOSIS — Z0000 Encounter for general adult medical examination without abnormal findings: Secondary | ICD-10-CM

## 2019-12-01 NOTE — Telephone Encounter (Signed)
Left another message to contact office and a final phone call stating they would have to contact office to reschedule since we were unable to connect for the televisit with pt today. Acen Craun Zimmerman Rumple, CMA

## 2019-12-01 NOTE — Telephone Encounter (Signed)
LVM to call office back.  Tried to reach him to go over the questions prior to his phone visit.  If he calls back please put him through to have this completed.Alfredia Desanctis Zimmerman Rumple, CMA

## 2019-12-01 NOTE — Progress Notes (Signed)
NO SHOW

## 2019-12-02 NOTE — Progress Notes (Signed)
Virtual Visit via Telephone Note  I connected with Wesley Gross on 12/03/2019 at  8:15 AM EST by telephone and verified that I am speaking with the correct person using two identifiers.  Location: Patient: Home Provider: In Clinic   I discussed the limitations, risks, security and privacy concerns of performing an evaluation and management service by telephone and the availability of in person appointments. I also discussed with the patient that there may be a patient responsible charge related to this service. The patient expressed understanding and agreed to proceed.   History of Present Illness: 11/03/2019 OV: Wesley Gross is here for CPE. He reports compliance on Atorvastatin 36m QD- denies myalgia's. Lipids/Hepatci fx panel will be re-checked next week He report ambulatory BP SBP 140-160s DBP 80-100s HR 60-80s He denies acute cardiac sx's He was previously on Amlodipine/Valsartan 5/160mg  QD-tolerated well and has been off >12 months He continues to smoke 2 packs/day- open to "thinking about tobacco cessation" after the new year. He has tried Nicoderm and Chantix in past- re-started tobacco after each therapy.  09/29/2019 Labs: TSH-WNL, 2.110  A1c-WML, 5.2  CMP-stable  CBC-stable  Lipid Panel-  Total-235  TGs-150  HDL-45  LDL-163, increased from 153  He was started on Atorvastatin 20mg  QD- needs hepatic fx/lipids re-checked end of Dec 2020  Healthcare Maintenance: Immunizations-UTD  12/03/2019 OV: Wesley Gross calls in for f/u: HTN, HLD He was re-started on Amlodipine/Valsartan 5/160mg  once daily. Ambulatory BP SBP 110-120 DBP 70-80 HR  When off anti-hypertensive  SBP150-160 DBP 100 He has been compliant on the Atorvastatin 20mg  QD, denies myalgia's He continues to drink 2-3 every other day. He reports activity during work- walks 1-2 miles/day, heavy lifting 5 days/week He continues to smoke 2 pack/day-agreeable to start Wellbutrin He is aging through marital  separation. He is Librarian, academic with [employer] He reports "felling elevated HR on a daily basis" only when at rest, "sometime when I wake up from a nap". He denies CP/chest tightness with exertion. He denies feeling "traditional palpitations, just a racing heart". He denies first degree family hx of MI/CVA He drinks 2 cups of coffee in am, sweet tea in afternoon. He has never checked his HR when this episodes occur. He denies dyspnea or diaphoresis when these episodes occur. Sx' present intermittently >6 months.  Patient Care Team    Relationship Specialty Notifications Start End  Wesley Grandchild, NP PCP - General Family Medicine  07/08/18     Patient Active Problem List   Diagnosis Date Noted  . HTN, goal below 130/80 11/03/2019  . Abnormal urinalysis 09/15/2019  . Mixed hyperlipidemia 10/14/2018  . Acute bilateral low back pain without sciatica 10/01/2018  . Healthcare maintenance 07/08/2018  . Plantar warts 07/08/2018  . Tobacco use disorder 07/08/2018  . Acute bronchitis 10/12/2017  . Pleurisy 10/12/2017  . Wheezing 10/12/2017  . Smoker 10/12/2017     Past Medical History:  Diagnosis Date  . Chronic kidney disease    kidney stones  . Hypertension      No past surgical history on file.   Family History  Problem Relation Age of Onset  . Hyperlipidemia Mother   . Hypertension Mother      Social History   Substance and Sexual Activity  Drug Use No     Social History   Substance and Sexual Activity  Alcohol Use Yes  . Alcohol/week: 3.0 standard drinks  . Types: 3 Cans of beer per week  Social History   Tobacco Use  Smoking Status Current Every Day Smoker  . Packs/day: 2.00  . Years: 22.00  . Pack years: 44.00  Smokeless Tobacco Never Used     Outpatient Encounter Medications as of 12/03/2019  Medication Sig  . amLODipine-valsartan (EXFORGE) 5-160 MG tablet Take 1 tablet by mouth daily.  Marland Kitchen atorvastatin (LIPITOR) 20 MG  tablet Take 1 tablet (20 mg total) by mouth daily.   No facility-administered encounter medications on file as of 12/03/2019.    Allergies: Cephalexin  There is no height or weight on file to calculate BMI.  There were no vitals taken for this visit. Review of Systems: General:   Denies fever, chills, unexplained weight loss.  Optho/Auditory:   Denies visual changes, blurred vision/LOV Respiratory:   Denies SOB, DOE more than baseline levels.  Cardiovascular:   Denies chest pain, palpitations , new onset peripheral edema Intermittently elevated HR  Gastrointestinal:   Denies nausea, vomiting, diarrhea.  Genitourinary: Denies dysuria, freq/ urgency, flank pain or discharge from genitals.  Endocrine:     Denies hot or cold intolerance, polyuria, polydipsia. Musculoskeletal:   Denies unexplained myalgias, joint swelling, unexplained arthralgias, gait problems.  Skin:  Denies rash, suspicious lesions Neurological:     Denies dizziness, unexplained weakness, numbness  Psychiatric/Behavioral:   Denies mood changes, suicidal or homicidal ideations, hallucinations   Observations/Objective: No acute distress noted during the telephone conversation  Assessment and Plan: Continue Exforge and Lipitor as directed. Start Wellbutrin SR 150mg  QD for 3 days the titrate to BID on day 4- set tobacco quit date 2 weeks after starting Wellbutrin. Record HR/daily Discussed Red Flag cardiac sx's and to seek immediate medical care if any develop- he verbalized understanding/agreement. Continue to social distance and wear a mask when in public.  Follow Up Instructions: 3 weeks f/u- Telemedicine   I discussed the assessment and treatment plan with the patient. The patient was provided an opportunity to ask questions and all were answered. The patient agreed with the plan and demonstrated an understanding of the instructions.   The patient was advised to call back or seek an in-person evaluation if the  symptoms worsen or if the condition fails to improve as anticipated.  I provided 18 minutes of non-face-to-face time during this encounter.   , NP

## 2019-12-03 ENCOUNTER — Other Ambulatory Visit: Payer: Self-pay

## 2019-12-03 ENCOUNTER — Ambulatory Visit (INDEPENDENT_AMBULATORY_CARE_PROVIDER_SITE_OTHER): Payer: 59 | Admitting: Adult Health

## 2019-12-03 ENCOUNTER — Encounter: Payer: Self-pay | Admitting: Adult Health

## 2019-12-03 DIAGNOSIS — F17200 Nicotine dependence, unspecified, uncomplicated: Secondary | ICD-10-CM

## 2019-12-03 DIAGNOSIS — Z0000 Encounter for general adult medical examination without abnormal findings: Secondary | ICD-10-CM

## 2019-12-03 DIAGNOSIS — I10 Essential (primary) hypertension: Secondary | ICD-10-CM

## 2019-12-03 DIAGNOSIS — E785 Hyperlipidemia, unspecified: Secondary | ICD-10-CM

## 2019-12-03 MED ORDER — BUPROPION HCL ER (SR) 150 MG PO TB12: ORAL_TABLET | ORAL | 0 refills | Status: DC

## 2019-12-03 NOTE — Assessment & Plan Note (Signed)
Start Wellbutrin SR 150mg  QD for 3 days the titrate to BID on day 4- set tobacco quit date 2 weeks after starting Wellbutrin.

## 2019-12-03 NOTE — Assessment & Plan Note (Signed)
Assessment and Plan: Continue Exforge and Lipitor as directed. Start Wellbutrin SR 150mg  QD for 3 days the titrate to BID on day 4- set tobacco quit date 2 weeks after starting Wellbutrin. Record HR/daily Discussed Red Flag cardiac sx's and to seek immediate medical care if any develop- he verbalized understanding/agreement. Continue to social distance and wear a mask when in public.  Follow Up Instructions: 3 weeks f/u- Telemedicine   I discussed the assessment and treatment plan with the patient. The patient was provided an opportunity to ask questions and all were answered. The patient agreed with the plan and demonstrated an understanding of the instructions.   The patient was advised to call back or seek an in-person evaluation if the symptoms worsen or if the condition fails to improve as anticipated.

## 2019-12-03 NOTE — Assessment & Plan Note (Signed)
BP well controlled when he remembers to take Exforge- encouraged compliance Check BP/HR daily- f/u 3 weeks

## 2019-12-24 ENCOUNTER — Ambulatory Visit: Payer: 59 | Admitting: Adult Health

## 2020-01-06 NOTE — Progress Notes (Signed)
Virtual Visit via Telephone Note  I connected with Maureen Ralphs on 01/07/2020 at  9:30 AM EST by telephone and verified that I am speaking with the correct person using two identifiers.  Location: Patient:Home Provider:In Clinic   I discussed the limitations, risks, security and privacy concerns of performing an evaluation and management service by telephone and the availability of in person appointments. I also discussed with the patient that there may be a patient responsible charge related to this service. The patient expressed understanding and agreed to proceed.   History of Present Illness: 12/03/2019 OV: Mr. Siever calls in for f/u: HTN, HLD He was re-started on Amlodipine/Valsartan 5/160mg  once daily. Ambulatory BP SBP 110-120 DBP 70-80 HR  When off anti-hypertensive  SBP150-160 DBP 100 He has been compliant on the Atorvastatin 20mg  QD, denies myalgia's He continues to drink 2-3 every other day. He reports activity during work- walks 1-2 miles/day, heavy lifting 5 days/week He continues to smoke 2 pack/day-agreeable to start Wellbutrin He is aging through marital separation. He is Librarian, academic with [employer] He reports "felling elevated HR on a daily basis" only when at rest, "sometime when I wake up from a nap". He denies CP/chest tightness with exertion. He denies feeling "traditional palpitations, just a racing heart". He denies first degree family hx of MI/CVA He drinks 2 cups of coffee in am, sweet tea in afternoon. He has never checked his HR when this episodes occur. He denies dyspnea or diaphoresis when these episodes occur. Sx' present intermittently >6 months. 01/07/2020 OV: Mr. Fester calls in for f/u: HTN and tobacco cessation  Ambulatory BP SBP 110-130 DBP 70-90 HR  80-100, with occasional increase HR in 130s- when not exerting himself. He denies chest pain/tightness with exertion. He has family hx of HTN, HLD, denies first degree family  hx of MI/CVA He is currently amlodipine/valsartan 5/160mg  QD He was started on Wellbutrin, has titrated up to 150mg  BID He has reduced tobacco use from two packs per day to one pack per day. He is agreeable for Holter Study, then +/_ ECHO depending on what Holter reveals.  L ureteral stent placed for treatment of nephrolithiasis  F/u with Nephrology March 2021 Patient Care Team    Relationship Specialty Notifications Start End  [name], NP PCP - General Family Medicine  07/08/18     Patient Active Problem List   Diagnosis Date Noted  . HTN, goal below 130/80 11/03/2019  . Abnormal urinalysis 09/15/2019  . Mixed hyperlipidemia 10/14/2018  . Acute bilateral low back pain without sciatica 10/01/2018  . Healthcare maintenance 07/08/2018  . Plantar warts 07/08/2018  . Tobacco use disorder 07/08/2018  . Acute bronchitis 10/12/2017  . Pleurisy 10/12/2017  . Wheezing 10/12/2017  . Smoker 10/12/2017     Past Medical History:  Diagnosis Date  . Chronic kidney disease    kidney stones  . Hypertension      No past surgical history on file.   Family History  Problem Relation Age of Onset  . Hyperlipidemia Mother   . Hypertension Mother      Social History   Substance and Sexual Activity  Drug Use No     Social History   Substance and Sexual Activity  Alcohol Use Yes  . Alcohol/week: 3.0 standard drinks  . Types: 3 Cans of beer per week     Social History   Tobacco Use  Smoking Status Current Every Day Smoker  . Packs/day: 2.00  .  Years: 22.00  . Pack years: 44.00  Smokeless Tobacco Never Used     Outpatient Encounter Medications as of 01/07/2020  Medication Sig  . amLODipine-valsartan (EXFORGE) 5-160 MG tablet Take 1 tablet by mouth daily.  Marland Kitchen atorvastatin (LIPITOR) 20 MG tablet Take 1 tablet (20 mg total) by mouth daily.  Marland Kitchen buPROPion (WELLBUTRIN SR) 150 MG 12 hr tablet 1 tab daily for 3 days, then increase to 1 tab every 12hrs-hold at this dose    No facility-administered encounter medications on file as of 01/07/2020.    Allergies: Cephalexin  There is no height or weight on file to calculate BMI.  There were no vitals taken for this visit. Review of Systems: General:   Denies fever, chills, unexplained weight loss.  Optho/Auditory:   Denies visual changes, blurred vision/LOV Respiratory:   Denies SOB, DOE more than baseline levels.  Cardiovascular:   Denies chest pain, palpitations, new onset peripheral edema  Intermittent tachycardia + Gastrointestinal:   Denies nausea, vomiting, diarrhea.  Genitourinary: Denies dysuria, freq/ urgency, flank pain or discharge from genitals.  Endocrine:     Denies hot or cold intolerance, polyuria, polydipsia. Musculoskeletal:   Denies unexplained myalgias, joint swelling, unexplained arthralgias, gait problems.  Skin:  Denies rash, suspicious lesions Neurological:     Denies dizziness, unexplained weakness, numbness  Psychiatric/Behavioral:   Denies mood changes, suicidal or homicidal ideations, hallucinations  Observations/Objective: No acute distress noted   Assessment and Plan: Continue all medications as directed. Remain well hydrated, follow heart healthy diet. Continue to reduce to stop tobacco use. Has been on Wellbutrin 150mg  BID for two weeks, refilled for 2 months. Holter Study ordered, re: intermittent elevated HR ECHO +/_ after Holter results Discussed Red Flag cardiac sx's and to seek immediate medical care if any develop- he verbalized understanding/agreement. Continue to social distance and wear a mask when in public.  Follow Up Instructions: 2 months OV   I discussed the assessment and treatment plan with the patient. The patient was provided an opportunity to ask questions and all were answered. The patient agreed with the plan and demonstrated an understanding of the instructions.   The patient was advised to call back or seek an in-person evaluation if the  symptoms worsen or if the condition fails to improve as anticipated.  I provided  15 minutes of non-face-to-face time during this encounter.   , NP

## 2020-01-07 ENCOUNTER — Ambulatory Visit (INDEPENDENT_AMBULATORY_CARE_PROVIDER_SITE_OTHER): Payer: 59 | Admitting: Adult Health

## 2020-01-07 ENCOUNTER — Other Ambulatory Visit: Payer: Self-pay

## 2020-01-07 ENCOUNTER — Encounter: Payer: Self-pay | Admitting: Adult Health

## 2020-01-07 VITALS — BP 119/92 | Ht 67.5 in | Wt 150.0 lb

## 2020-01-07 DIAGNOSIS — I10 Essential (primary) hypertension: Secondary | ICD-10-CM

## 2020-01-07 DIAGNOSIS — R000 Tachycardia, unspecified: Secondary | ICD-10-CM | POA: Diagnosis not present

## 2020-01-07 DIAGNOSIS — F17200 Nicotine dependence, unspecified, uncomplicated: Secondary | ICD-10-CM | POA: Diagnosis not present

## 2020-01-07 DIAGNOSIS — E782 Mixed hyperlipidemia: Secondary | ICD-10-CM | POA: Diagnosis not present

## 2020-01-07 MED ORDER — BUPROPION HCL ER (SR) 150 MG PO TB12: ORAL_TABLET | ORAL | 2 refills | Status: DC

## 2020-01-07 NOTE — Addendum Note (Signed)
Addended by: Stan Head on: 01/07/2020 10:23 AM   Modules accepted: Orders

## 2020-01-07 NOTE — Assessment & Plan Note (Signed)
Assessment and Plan: Continue all medications as directed. Remain well hydrated, follow heart healthy diet. Continue to reduce to stop tobacco use. Has been on Wellbutrin 150mg  BID for two weeks, refilled for 2 months. Holter Study ordered, re: intermittent elevated HR ECHO +/_ after Holter results Discussed Red Flag cardiac sx's and to seek immediate medical care if any develop- he verbalized understanding/agreement. Continue to social distance and wear a mask when in public.  Follow Up Instructions: 2 months OV   I discussed the assessment and treatment plan with the patient. The patient was provided an opportunity to ask questions and all were answered. The patient agreed with the plan and demonstrated an understanding of the instructions.   The patient was advised to call back or seek an in-person evaluation if the symptoms worsen or if the condition fails to improve as anticipated.

## 2020-01-07 NOTE — Assessment & Plan Note (Signed)
Atorvastatin 20mg  QD 11/10/2019 Cholesterol, Total 100 - 199 mg/dL 11/12/2019   Triglycerides 0 - 149 mg/dL 970   HDL 263 mg/dL 45   VLDL Cholesterol Cal 5 - 40 mg/dL 25   LDL Chol Calc (NIH) 0 - 99 mg/dL >78    Chol/HDL Ratio 0.0 - 5.0 ratio 3.8   Hepatic fx panel- stable

## 2020-01-07 NOTE — Assessment & Plan Note (Signed)
Reduced from 2 packs to 1 pack per day. Continue to reduce to stop tobacco use. Has been on Wellbutrin 150mg  BID for two weeks, refilled for 2 months.

## 2020-01-08 ENCOUNTER — Telehealth: Payer: Self-pay | Admitting: *Deleted

## 2020-01-08 NOTE — Telephone Encounter (Signed)
Patient enrolled for [employer] to mail a 3 day ZIO XT long term holter monitor to his home. 

## 2020-01-23 ENCOUNTER — Ambulatory Visit (INDEPENDENT_AMBULATORY_CARE_PROVIDER_SITE_OTHER): Payer: 59

## 2020-01-23 DIAGNOSIS — R000 Tachycardia, unspecified: Secondary | ICD-10-CM

## 2020-02-27 ENCOUNTER — Other Ambulatory Visit: Payer: Self-pay | Admitting: Family Medicine

## 2020-03-01 ENCOUNTER — Telehealth: Payer: Self-pay | Admitting: Adult Health

## 2020-03-08 ENCOUNTER — Ambulatory Visit: Payer: 59 | Admitting: Physician Assistant

## 2020-03-30 ENCOUNTER — Other Ambulatory Visit: Payer: Self-pay

## 2020-03-30 ENCOUNTER — Telehealth (INDEPENDENT_AMBULATORY_CARE_PROVIDER_SITE_OTHER): Payer: 59 | Admitting: Physician Assistant

## 2020-03-30 ENCOUNTER — Encounter: Payer: Self-pay | Admitting: Physician Assistant

## 2020-03-30 VITALS — BP 117/83 | Temp 98.1°F | Ht 67.25 in | Wt 156.0 lb

## 2020-03-30 DIAGNOSIS — I10 Essential (primary) hypertension: Secondary | ICD-10-CM | POA: Diagnosis not present

## 2020-03-30 DIAGNOSIS — F17200 Nicotine dependence, unspecified, uncomplicated: Secondary | ICD-10-CM | POA: Diagnosis not present

## 2020-03-30 DIAGNOSIS — E782 Mixed hyperlipidemia: Secondary | ICD-10-CM | POA: Diagnosis not present

## 2020-03-30 MED ORDER — AMLODIPINE BESYLATE-VALSARTAN 5-160 MG PO TABS: 1.0000 | ORAL_TABLET | Freq: Every day | ORAL | 1 refills | Status: AC

## 2020-03-30 MED ORDER — ATORVASTATIN CALCIUM 20 MG PO TABS: 20.0000 mg | ORAL_TABLET | Freq: Every day | ORAL | 1 refills | Status: AC

## 2020-03-30 NOTE — Progress Notes (Signed)
Telehealth office visit note for Wesley Masker, PA-C- at Primary Care at [facility]   I connected with current patient today and verified that I am speaking with the correct person   . Location of the patient: Home . Location of the provider: Office - This visit type was conducted due to national recommendations for restrictions regarding the COVID-19 Pandemic (e.g. social distancing) in an effort to limit this patient's exposure and mitigate transmission in our community.    - No physical exam could be performed with this format, beyond that communicated to [place] by the patient/ family members as noted.   - Additionally my office staff/ schedulers were to discuss with the patient that there may be a monetary charge related to this service, depending on their medical insurance.  My understanding is that patient understood and consented to proceed.     _________________________________________________________________________________   History of Present Illness: Pt calls in for follow-up on HTN, HLD, and tobacco use disorder.   HTN: Pt denies chest pain, palpitations, dizziness or leg swelling. Taking medication as directed without side effects. Checks BP at home 1-2 times/wk and readings range in 120s/80s.   HLD: Pt taking medication as directed without issues. Denies side effects including myalgias and RUQ pain. Reports he has improved his diet and has reduced cholesterol/sat and trans fat. 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  Tobacco Use Disorder: Pt reports not taking Wellbutrin. He hasn't completely stopped smoking but has reduced his use from 2 PPD to 1 PPD. He is currently going through a divorce and not ready to quit at this time due to personal stress.     No flowsheet data found.  Depression screen [facility] 2/9 03/30/2020 01/07/2020 11/03/2019 09/15/2019 10/14/2018  Decreased Interest 0 0 0 0 1  Down, Depressed, Hopeless 0 0 1 0 1  PHQ - 2 Score 0 0 1 0 2   Altered sleeping 0 0 2 1 1   Tired, decreased energy 0 1 0 0 1  Change in appetite 0 0 0 0 0  Feeling bad or failure about yourself  0 0 0 0 0  Trouble concentrating 3 2 1  0 1  Moving slowly or fidgety/restless 0 0 1 0 0  Suicidal thoughts 0 0 0 0 0  PHQ-9 Score 3 3 5 1 5   Difficult doing work/chores Very difficult Somewhat difficult Somewhat difficult Not difficult at all Somewhat difficult      Impression and Recommendations:     1. HTN, goal below 130/80   2. Mixed hyperlipidemia   3. Tobacco use disorder      HTN: - BP today is 117/83, at goal. - Continue amlodipine-valsartan 5-160 mg once daily.  - Continue ambulatory BP and pulse monitoring. - Encourage low salt diet. - Will continue to monitor.  HLD: - Last lipid panel stable. LDL significantly improved. - Continue Atorvastatin 20 mg once daily. - Continue heart healthy diet and stay as active as possible.  - Plan to recheck lipid panel and hepatic function at next OV.   Tobacco Use Disorder: - Pt has reduced smoking to half and encouraged to continue to try and reduce more.    - As part of my medical decision making, I reviewed the following data within the electronic MEDICAL RECORD NUMBER History obtained from pt /family, CMA notes reviewed and incorporated if applicable, Labs reviewed, Radiograph/ tests reviewed if applicable and OV notes from prior OV's with me,  as well as any other specialists she/he has seen since seeing me last, were all reviewed and used in my medical decision making process today.    - Additionally, when appropriate, discussion had with patient regarding our treatment plan, and their biases/concerns about that plan were used in my medical decision making today.    - The patient agreed with the plan and demonstrated an understanding of the instructions.   No barriers to understanding were identified.     - The patient was advised to call back or seek an in-person evaluation if the symptoms  worsen or if the condition fails to improve as anticipated.   Return for HTN, HLD, tobacco use disorder and FBW in 4-6 mons.    No orders of the defined types were placed in this encounter.   Meds ordered this encounter  Medications  . amLODipine-valsartan (EXFORGE) 5-160 MG tablet    Sig: Take 1 tablet by mouth daily.    Dispense:  90 tablet    Refill:  1    Order Specific Question:   Supervising Provider    Answer:   Beatrice Lecher D [[id number]]  . atorvastatin (LIPITOR) 20 MG tablet    Sig: Take 1 tablet (20 mg total) by mouth daily.    Dispense:  90 tablet    Refill:  1    Order Specific Question:   Supervising Provider    Answer:   Beatrice Lecher D [[id number]]    Medications Discontinued During This Encounter  Medication Reason  . buPROPion (WELLBUTRIN SR) 150 MG 12 hr tablet Patient Preference  . atorvastatin (LIPITOR) 20 MG tablet Reorder  . amLODipine-valsartan (EXFORGE) 5-160 MG tablet Reorder       Time spent on visit including pre-visit chart review and post-visit care was 12 minutes.     The [place] was signed into law in 2016 which includes the topic of electronic health records.  This provides immediate access to information in MyChart.  This includes consultation notes, operative notes, office notes, lab results and pathology reports.  If you have any questions about what you read please let us know at your next visit or call us at the office.  We are right here with you.   __________________________________________________________________________________     No care team member to display   -Vitals obtained; medications/ allergies reconciled;  personal medical, social, Sx etc.histories were updated by CMA, reviewed by me and are reflected in chart   Patient Active Problem List   Diagnosis Date Noted  . HTN, goal below 130/80 11/03/2019  . Abnormal urinalysis 09/15/2019  . Mixed hyperlipidemia 10/14/2018  . Acute bilateral low  back pain without sciatica 10/01/2018  . Healthcare maintenance 07/08/2018  . Plantar warts 07/08/2018  . Tobacco use disorder 07/08/2018  . Acute bronchitis 10/12/2017  . Pleurisy 10/12/2017  . Wheezing 10/12/2017  . Smoker 10/12/2017     Current Meds  Medication Sig  . amLODipine-valsartan (EXFORGE) 5-160 MG tablet Take 1 tablet by mouth daily.  Marland Kitchen atorvastatin (LIPITOR) 20 MG tablet Take 1 tablet (20 mg total) by mouth daily.  . [DISCONTINUED] amLODipine-valsartan (EXFORGE) 5-160 MG tablet TAKE 1 TABLET BY MOUTH DAILY.  . [DISCONTINUED] atorvastatin (LIPITOR) 20 MG tablet Take 1 tablet (20 mg total) by mouth daily.     Allergies:  Allergies  Allergen Reactions  . Cephalexin Nausea And Vomiting     ROS:  See above HPI for pertinent positives and negatives   Objective:   Blood  pressure 117/83, temperature 98.1 F (36.7 C), temperature source Oral, height 5' 7.25" (1.708 m), weight 156 lb (70.8 kg).  (if some vitals are omitted, this means that patient was UNABLE to obtain them even though they were asked to get them prior to OV today.  They were asked to call us at their earliest convenience with these once obtained. ) General: A & O * 3; sounds in no acute distress; in usual state of health.  Skin: Pt confirms warm and dry extremities and pink fingertips HEENT: Pt confirms lips non-cyanotic Chest: Patient confirms normal chest excursion and movement Respiratory: speaking in full sentences, no conversational dyspnea; patient confirms no use of accessory muscles Psych: insight appears good, mood- appears full

## 2021-09-12 NOTE — Telephone Encounter (Signed)
Priscilla from [facility] called to schedule NP appointment. Will be sending the referral via fax, will be retrieved again from the referral.

## 2021-09-28 ENCOUNTER — Encounter: Attending: Specialist
